# Patient Record
Sex: Female | Born: 1975 | Race: White | Hispanic: Yes | Smoking: Former smoker
Health system: Southern US, Community
[De-identification: ages and names within clinical notes are randomized; demographics above are authoritative.]

## PROBLEM LIST (undated history)

## (undated) DIAGNOSIS — D649 Anemia, unspecified: Secondary | ICD-10-CM

## (undated) DIAGNOSIS — R112 Nausea with vomiting, unspecified: Secondary | ICD-10-CM

## (undated) DIAGNOSIS — R519 Headache, unspecified: Secondary | ICD-10-CM

## (undated) DIAGNOSIS — R51 Headache: Secondary | ICD-10-CM

## (undated) DIAGNOSIS — Z9889 Other specified postprocedural states: Secondary | ICD-10-CM

## (undated) DIAGNOSIS — Z9289 Personal history of other medical treatment: Secondary | ICD-10-CM

## (undated) DIAGNOSIS — Z5189 Encounter for other specified aftercare: Secondary | ICD-10-CM

## (undated) DIAGNOSIS — J45909 Unspecified asthma, uncomplicated: Secondary | ICD-10-CM

## (undated) DIAGNOSIS — J189 Pneumonia, unspecified organism: Secondary | ICD-10-CM

## (undated) HISTORY — PX: ABDOMINAL HYSTERECTOMY: SHX81

## (undated) HISTORY — PX: CHOLECYSTECTOMY: SHX55

## (undated) HISTORY — PX: WISDOM TOOTH EXTRACTION: SHX21

## (undated) HISTORY — PX: NASAL SEPTUM SURGERY: SHX37

---

## 1998-04-14 ENCOUNTER — Ambulatory Visit (HOSPITAL_COMMUNITY): Admission: RE | Admit: 1998-04-14 | Discharge: 1998-04-14 | Payer: Self-pay | Admitting: *Deleted

## 1998-07-22 ENCOUNTER — Encounter: Payer: Self-pay | Admitting: Emergency Medicine

## 1998-07-22 ENCOUNTER — Inpatient Hospital Stay (HOSPITAL_COMMUNITY): Admission: EM | Admit: 1998-07-22 | Discharge: 1998-07-24 | Payer: Self-pay | Admitting: Emergency Medicine

## 1998-07-27 ENCOUNTER — Encounter: Admission: RE | Admit: 1998-07-27 | Discharge: 1998-07-27 | Payer: Self-pay | Admitting: Sports Medicine

## 1998-11-01 ENCOUNTER — Emergency Department (HOSPITAL_COMMUNITY): Admission: EM | Admit: 1998-11-01 | Discharge: 1998-11-01 | Payer: Self-pay | Admitting: Emergency Medicine

## 1998-12-22 ENCOUNTER — Emergency Department (HOSPITAL_COMMUNITY): Admission: EM | Admit: 1998-12-22 | Discharge: 1998-12-22 | Payer: Self-pay | Admitting: Emergency Medicine

## 2000-09-03 ENCOUNTER — Encounter: Payer: Self-pay | Admitting: Emergency Medicine

## 2000-09-03 ENCOUNTER — Emergency Department (HOSPITAL_COMMUNITY): Admission: EM | Admit: 2000-09-03 | Discharge: 2000-09-03 | Payer: Self-pay | Admitting: Emergency Medicine

## 2000-09-29 ENCOUNTER — Emergency Department (HOSPITAL_COMMUNITY): Admission: EM | Admit: 2000-09-29 | Discharge: 2000-09-29 | Payer: Self-pay | Admitting: Emergency Medicine

## 2000-09-30 ENCOUNTER — Observation Stay (HOSPITAL_COMMUNITY): Admission: EM | Admit: 2000-09-30 | Discharge: 2000-10-01 | Payer: Self-pay | Admitting: Emergency Medicine

## 2000-09-30 ENCOUNTER — Encounter: Payer: Self-pay | Admitting: *Deleted

## 2001-02-07 ENCOUNTER — Encounter: Payer: Self-pay | Admitting: Emergency Medicine

## 2001-02-07 ENCOUNTER — Emergency Department (HOSPITAL_COMMUNITY): Admission: EM | Admit: 2001-02-07 | Discharge: 2001-02-07 | Payer: Self-pay | Admitting: Emergency Medicine

## 2001-03-29 ENCOUNTER — Observation Stay (HOSPITAL_COMMUNITY): Admission: AD | Admit: 2001-03-29 | Discharge: 2001-03-30 | Payer: Self-pay | Admitting: Obstetrics & Gynecology

## 2001-03-29 ENCOUNTER — Encounter: Payer: Self-pay | Admitting: *Deleted

## 2001-03-29 ENCOUNTER — Inpatient Hospital Stay (HOSPITAL_COMMUNITY): Admission: AD | Admit: 2001-03-29 | Discharge: 2001-03-29 | Payer: Self-pay | Admitting: Obstetrics & Gynecology

## 2001-04-17 ENCOUNTER — Encounter: Admission: RE | Admit: 2001-04-17 | Discharge: 2001-04-17 | Payer: Self-pay | Admitting: Family Medicine

## 2001-04-24 ENCOUNTER — Ambulatory Visit (HOSPITAL_COMMUNITY): Admission: RE | Admit: 2001-04-24 | Discharge: 2001-04-24 | Payer: Self-pay | Admitting: Obstetrics & Gynecology

## 2001-05-06 ENCOUNTER — Encounter: Admission: RE | Admit: 2001-05-06 | Discharge: 2001-05-06 | Payer: Self-pay | Admitting: Family Medicine

## 2001-06-10 ENCOUNTER — Encounter: Admission: RE | Admit: 2001-06-10 | Discharge: 2001-06-10 | Payer: Self-pay | Admitting: Family Medicine

## 2001-06-22 ENCOUNTER — Inpatient Hospital Stay (HOSPITAL_COMMUNITY): Admission: AD | Admit: 2001-06-22 | Discharge: 2001-06-22 | Payer: Self-pay | Admitting: *Deleted

## 2001-06-24 ENCOUNTER — Encounter: Admission: RE | Admit: 2001-06-24 | Discharge: 2001-06-24 | Payer: Self-pay | Admitting: Family Medicine

## 2001-06-26 ENCOUNTER — Encounter: Admission: RE | Admit: 2001-06-26 | Discharge: 2001-06-26 | Payer: Self-pay | Admitting: Family Medicine

## 2001-07-05 ENCOUNTER — Ambulatory Visit (HOSPITAL_COMMUNITY): Admission: RE | Admit: 2001-07-05 | Discharge: 2001-07-05 | Payer: Self-pay

## 2001-07-12 ENCOUNTER — Encounter: Admission: RE | Admit: 2001-07-12 | Discharge: 2001-07-12 | Payer: Self-pay | Admitting: Family Medicine

## 2001-08-04 ENCOUNTER — Inpatient Hospital Stay (HOSPITAL_COMMUNITY): Admission: AD | Admit: 2001-08-04 | Discharge: 2001-08-04 | Payer: Self-pay | Admitting: *Deleted

## 2001-08-16 ENCOUNTER — Encounter: Admission: RE | Admit: 2001-08-16 | Discharge: 2001-08-16 | Payer: Self-pay | Admitting: Family Medicine

## 2001-08-23 ENCOUNTER — Encounter: Admission: RE | Admit: 2001-08-23 | Discharge: 2001-08-23 | Payer: Self-pay | Admitting: Family Medicine

## 2001-08-26 ENCOUNTER — Encounter: Admission: RE | Admit: 2001-08-26 | Discharge: 2001-08-26 | Payer: Self-pay | Admitting: Family Medicine

## 2001-08-26 ENCOUNTER — Ambulatory Visit (HOSPITAL_COMMUNITY): Admission: RE | Admit: 2001-08-26 | Discharge: 2001-08-26 | Payer: Self-pay | Admitting: Family Medicine

## 2001-09-02 ENCOUNTER — Encounter: Admission: RE | Admit: 2001-09-02 | Discharge: 2001-09-02 | Payer: Self-pay | Admitting: Family Medicine

## 2001-09-11 ENCOUNTER — Encounter: Admission: RE | Admit: 2001-09-11 | Discharge: 2001-09-11 | Payer: Self-pay | Admitting: Family Medicine

## 2001-09-15 ENCOUNTER — Inpatient Hospital Stay (HOSPITAL_COMMUNITY): Admission: AD | Admit: 2001-09-15 | Discharge: 2001-09-16 | Payer: Self-pay | Admitting: *Deleted

## 2001-10-23 ENCOUNTER — Encounter: Admission: RE | Admit: 2001-10-23 | Discharge: 2001-10-23 | Payer: Self-pay | Admitting: Family Medicine

## 2001-10-28 ENCOUNTER — Encounter: Admission: RE | Admit: 2001-10-28 | Discharge: 2001-10-28 | Payer: Self-pay | Admitting: Family Medicine

## 2002-01-27 ENCOUNTER — Encounter: Admission: RE | Admit: 2002-01-27 | Discharge: 2002-01-27 | Payer: Self-pay | Admitting: Family Medicine

## 2002-05-08 ENCOUNTER — Encounter: Admission: RE | Admit: 2002-05-08 | Discharge: 2002-05-08 | Payer: Self-pay | Admitting: Family Medicine

## 2002-05-26 ENCOUNTER — Encounter: Admission: RE | Admit: 2002-05-26 | Discharge: 2002-05-26 | Payer: Self-pay | Admitting: Family Medicine

## 2002-06-18 ENCOUNTER — Encounter: Admission: RE | Admit: 2002-06-18 | Discharge: 2002-06-18 | Payer: Self-pay | Admitting: Family Medicine

## 2002-07-01 ENCOUNTER — Encounter: Admission: RE | Admit: 2002-07-01 | Discharge: 2002-07-01 | Payer: Self-pay | Admitting: Family Medicine

## 2002-07-03 ENCOUNTER — Encounter: Admission: RE | Admit: 2002-07-03 | Discharge: 2002-07-03 | Payer: Self-pay | Admitting: Sports Medicine

## 2002-08-18 ENCOUNTER — Encounter: Admission: RE | Admit: 2002-08-18 | Discharge: 2002-08-18 | Payer: Self-pay | Admitting: Family Medicine

## 2002-12-22 ENCOUNTER — Encounter: Admission: RE | Admit: 2002-12-22 | Discharge: 2002-12-22 | Payer: Self-pay | Admitting: Family Medicine

## 2002-12-24 ENCOUNTER — Ambulatory Visit (HOSPITAL_COMMUNITY): Admission: RE | Admit: 2002-12-24 | Discharge: 2002-12-24 | Payer: Self-pay | Admitting: Family Medicine

## 2002-12-29 ENCOUNTER — Encounter: Admission: RE | Admit: 2002-12-29 | Discharge: 2002-12-29 | Payer: Self-pay | Admitting: Family Medicine

## 2003-01-20 ENCOUNTER — Encounter: Admission: RE | Admit: 2003-01-20 | Discharge: 2003-01-20 | Payer: Self-pay | Admitting: Family Medicine

## 2003-05-11 ENCOUNTER — Encounter: Admission: RE | Admit: 2003-05-11 | Discharge: 2003-05-11 | Payer: Self-pay | Admitting: Family Medicine

## 2003-05-25 ENCOUNTER — Encounter: Admission: RE | Admit: 2003-05-25 | Discharge: 2003-05-25 | Payer: Self-pay | Admitting: Family Medicine

## 2003-06-09 ENCOUNTER — Ambulatory Visit (HOSPITAL_COMMUNITY): Admission: RE | Admit: 2003-06-09 | Discharge: 2003-06-10 | Payer: Self-pay | Admitting: General Surgery

## 2003-06-09 ENCOUNTER — Encounter (INDEPENDENT_AMBULATORY_CARE_PROVIDER_SITE_OTHER): Payer: Self-pay | Admitting: *Deleted

## 2003-08-21 ENCOUNTER — Encounter (INDEPENDENT_AMBULATORY_CARE_PROVIDER_SITE_OTHER): Payer: Self-pay | Admitting: *Deleted

## 2003-08-21 LAB — CONVERTED CEMR LAB

## 2003-08-26 ENCOUNTER — Encounter: Admission: RE | Admit: 2003-08-26 | Discharge: 2003-08-26 | Payer: Self-pay | Admitting: Family Medicine

## 2003-11-18 ENCOUNTER — Encounter: Admission: RE | Admit: 2003-11-18 | Discharge: 2003-11-18 | Payer: Self-pay | Admitting: Family Medicine

## 2004-01-15 ENCOUNTER — Encounter: Admission: RE | Admit: 2004-01-15 | Discharge: 2004-01-15 | Payer: Self-pay | Admitting: Family Medicine

## 2004-02-12 ENCOUNTER — Ambulatory Visit: Payer: Self-pay | Admitting: Family Medicine

## 2004-03-16 ENCOUNTER — Ambulatory Visit: Payer: Self-pay | Admitting: Family Medicine

## 2004-04-05 ENCOUNTER — Ambulatory Visit: Payer: Self-pay | Admitting: Sports Medicine

## 2004-05-11 ENCOUNTER — Ambulatory Visit: Payer: Self-pay | Admitting: Sports Medicine

## 2005-03-21 ENCOUNTER — Ambulatory Visit: Payer: Self-pay | Admitting: Family Medicine

## 2005-04-18 ENCOUNTER — Ambulatory Visit: Payer: Self-pay | Admitting: Family Medicine

## 2006-07-19 DIAGNOSIS — IMO0002 Reserved for concepts with insufficient information to code with codable children: Secondary | ICD-10-CM

## 2006-07-19 DIAGNOSIS — K8020 Calculus of gallbladder without cholecystitis without obstruction: Secondary | ICD-10-CM | POA: Insufficient documentation

## 2006-07-19 DIAGNOSIS — J309 Allergic rhinitis, unspecified: Secondary | ICD-10-CM | POA: Insufficient documentation

## 2006-07-20 ENCOUNTER — Encounter (INDEPENDENT_AMBULATORY_CARE_PROVIDER_SITE_OTHER): Payer: Self-pay | Admitting: *Deleted

## 2006-10-16 ENCOUNTER — Ambulatory Visit: Payer: Self-pay | Admitting: Family Medicine

## 2006-10-16 ENCOUNTER — Encounter (INDEPENDENT_AMBULATORY_CARE_PROVIDER_SITE_OTHER): Payer: Self-pay | Admitting: Family Medicine

## 2006-10-16 LAB — CONVERTED CEMR LAB
Chlamydia, DNA Probe: NEGATIVE
GC Probe Amp, Genital: NEGATIVE
Whiff Test: POSITIVE

## 2006-11-05 ENCOUNTER — Ambulatory Visit: Payer: Self-pay | Admitting: Family Medicine

## 2006-11-06 ENCOUNTER — Encounter: Payer: Self-pay | Admitting: *Deleted

## 2006-11-07 ENCOUNTER — Encounter (INDEPENDENT_AMBULATORY_CARE_PROVIDER_SITE_OTHER): Payer: Self-pay | Admitting: Family Medicine

## 2006-11-09 ENCOUNTER — Encounter (INDEPENDENT_AMBULATORY_CARE_PROVIDER_SITE_OTHER): Payer: Self-pay | Admitting: Family Medicine

## 2007-08-28 ENCOUNTER — Ambulatory Visit: Payer: Self-pay | Admitting: Family Medicine

## 2007-09-09 ENCOUNTER — Ambulatory Visit: Payer: Self-pay | Admitting: Sports Medicine

## 2007-09-09 LAB — CONVERTED CEMR LAB: Rapid Strep: POSITIVE

## 2007-09-20 ENCOUNTER — Ambulatory Visit: Payer: Self-pay | Admitting: Family Medicine

## 2007-09-25 ENCOUNTER — Encounter (INDEPENDENT_AMBULATORY_CARE_PROVIDER_SITE_OTHER): Payer: Self-pay | Admitting: Family Medicine

## 2007-09-30 ENCOUNTER — Telehealth (INDEPENDENT_AMBULATORY_CARE_PROVIDER_SITE_OTHER): Payer: Self-pay | Admitting: *Deleted

## 2007-10-07 ENCOUNTER — Encounter (INDEPENDENT_AMBULATORY_CARE_PROVIDER_SITE_OTHER): Payer: Self-pay | Admitting: *Deleted

## 2007-10-18 ENCOUNTER — Telehealth: Payer: Self-pay | Admitting: *Deleted

## 2007-10-21 ENCOUNTER — Encounter (INDEPENDENT_AMBULATORY_CARE_PROVIDER_SITE_OTHER): Payer: Self-pay | Admitting: Family Medicine

## 2007-10-21 ENCOUNTER — Ambulatory Visit: Payer: Self-pay | Admitting: Family Medicine

## 2007-10-21 LAB — CONVERTED CEMR LAB
Chlamydia, DNA Probe: NEGATIVE
GC Probe Amp, Genital: NEGATIVE

## 2008-01-15 ENCOUNTER — Ambulatory Visit: Payer: Self-pay | Admitting: Family Medicine

## 2008-01-15 ENCOUNTER — Telehealth: Payer: Self-pay | Admitting: *Deleted

## 2008-01-15 ENCOUNTER — Encounter (INDEPENDENT_AMBULATORY_CARE_PROVIDER_SITE_OTHER): Payer: Self-pay | Admitting: Family Medicine

## 2008-01-27 ENCOUNTER — Emergency Department (HOSPITAL_COMMUNITY): Admission: EM | Admit: 2008-01-27 | Discharge: 2008-01-27 | Payer: Self-pay | Admitting: Emergency Medicine

## 2008-03-20 ENCOUNTER — Telehealth: Payer: Self-pay | Admitting: *Deleted

## 2008-03-20 ENCOUNTER — Ambulatory Visit: Payer: Self-pay | Admitting: Family Medicine

## 2008-03-21 ENCOUNTER — Emergency Department (HOSPITAL_COMMUNITY): Admission: EM | Admit: 2008-03-21 | Discharge: 2008-03-21 | Payer: Self-pay | Admitting: Family Medicine

## 2008-03-26 ENCOUNTER — Ambulatory Visit: Payer: Self-pay | Admitting: Family Medicine

## 2008-03-26 ENCOUNTER — Telehealth: Payer: Self-pay | Admitting: *Deleted

## 2008-03-26 ENCOUNTER — Ambulatory Visit (HOSPITAL_COMMUNITY): Admission: RE | Admit: 2008-03-26 | Discharge: 2008-03-26 | Payer: Self-pay | Admitting: Family Medicine

## 2008-03-26 LAB — CONVERTED CEMR LAB: Rapid Strep: POSITIVE

## 2008-05-27 ENCOUNTER — Telehealth: Payer: Self-pay | Admitting: *Deleted

## 2008-05-28 ENCOUNTER — Telehealth: Payer: Self-pay | Admitting: *Deleted

## 2008-05-29 ENCOUNTER — Encounter (INDEPENDENT_AMBULATORY_CARE_PROVIDER_SITE_OTHER): Payer: Self-pay | Admitting: Family Medicine

## 2008-05-29 ENCOUNTER — Ambulatory Visit: Payer: Self-pay | Admitting: Family Medicine

## 2008-05-29 DIAGNOSIS — D509 Iron deficiency anemia, unspecified: Secondary | ICD-10-CM

## 2008-05-29 LAB — CONVERTED CEMR LAB
Albumin: 4.3 g/dL (ref 3.5–5.2)
Alkaline Phosphatase: 58 units/L (ref 39–117)
CO2: 22 meq/L (ref 19–32)
Calcium: 8.7 mg/dL (ref 8.4–10.5)
Chloride: 105 meq/L (ref 96–112)
Glucose, Bld: 78 mg/dL (ref 70–99)
Hemoglobin: 8.2 g/dL — ABNORMAL LOW (ref 12.0–15.0)
MCHC: 28.3 g/dL — ABNORMAL LOW (ref 30.0–36.0)
Potassium: 4.1 meq/L (ref 3.5–5.3)
RBC: 4.46 M/uL (ref 3.87–5.11)
Sodium: 139 meq/L (ref 135–145)
Total Protein: 7.3 g/dL (ref 6.0–8.3)
WBC: 6.2 10*3/uL (ref 4.0–10.5)

## 2008-06-01 ENCOUNTER — Encounter (INDEPENDENT_AMBULATORY_CARE_PROVIDER_SITE_OTHER): Payer: Self-pay | Admitting: Family Medicine

## 2008-06-01 LAB — CONVERTED CEMR LAB
Iron: 11 ug/dL — ABNORMAL LOW (ref 42–145)
Vitamin B-12: 647 pg/mL (ref 211–911)

## 2008-07-07 ENCOUNTER — Encounter (INDEPENDENT_AMBULATORY_CARE_PROVIDER_SITE_OTHER): Payer: Self-pay | Admitting: Family Medicine

## 2008-07-07 ENCOUNTER — Ambulatory Visit: Payer: Self-pay | Admitting: Family Medicine

## 2008-07-07 LAB — CONVERTED CEMR LAB: Retic Ct Pct: 0.8 % (ref 0.4–3.1)

## 2008-07-24 ENCOUNTER — Ambulatory Visit: Payer: Self-pay | Admitting: Family Medicine

## 2008-07-24 ENCOUNTER — Encounter (INDEPENDENT_AMBULATORY_CARE_PROVIDER_SITE_OTHER): Payer: Self-pay | Admitting: Family Medicine

## 2008-07-24 DIAGNOSIS — F5089 Other specified eating disorder: Secondary | ICD-10-CM | POA: Insufficient documentation

## 2008-07-24 LAB — CONVERTED CEMR LAB
Chlamydia, DNA Probe: NEGATIVE
GC Probe Amp, Genital: NEGATIVE

## 2008-07-27 ENCOUNTER — Encounter: Payer: Self-pay | Admitting: Family Medicine

## 2008-11-25 ENCOUNTER — Encounter: Payer: Self-pay | Admitting: Family Medicine

## 2008-11-25 ENCOUNTER — Ambulatory Visit: Payer: Self-pay | Admitting: Family Medicine

## 2008-11-25 LAB — CONVERTED CEMR LAB
Cholesterol: 164 mg/dL (ref 0–200)
MCHC: 29.5 g/dL — ABNORMAL LOW (ref 30.0–36.0)
MCV: 83.5 fL (ref 78.0–100.0)
Platelets: 484 10*3/uL — ABNORMAL HIGH (ref 150–400)
Total CHOL/HDL Ratio: 4.6
WBC: 7.7 10*3/uL (ref 4.0–10.5)

## 2008-11-27 ENCOUNTER — Telehealth: Payer: Self-pay | Admitting: Family Medicine

## 2008-11-30 ENCOUNTER — Encounter: Payer: Self-pay | Admitting: Family Medicine

## 2009-03-17 ENCOUNTER — Ambulatory Visit: Payer: Self-pay | Admitting: Family Medicine

## 2009-03-17 DIAGNOSIS — E781 Pure hyperglyceridemia: Secondary | ICD-10-CM | POA: Insufficient documentation

## 2009-04-26 ENCOUNTER — Ambulatory Visit: Payer: Self-pay | Admitting: Obstetrics & Gynecology

## 2009-04-26 ENCOUNTER — Ambulatory Visit: Admission: AD | Admit: 2009-04-26 | Discharge: 2009-04-26 | Payer: Self-pay | Admitting: Obstetrics & Gynecology

## 2009-04-27 ENCOUNTER — Encounter: Payer: Self-pay | Admitting: Family Medicine

## 2009-05-05 ENCOUNTER — Ambulatory Visit: Payer: Self-pay | Admitting: Family Medicine

## 2009-05-05 ENCOUNTER — Encounter: Payer: Self-pay | Admitting: Family Medicine

## 2009-05-05 LAB — CONVERTED CEMR LAB
Hemoglobin: 8.8 g/dL — ABNORMAL LOW (ref 12.0–15.0)
MCHC: 27.9 g/dL — ABNORMAL LOW (ref 30.0–36.0)
MCV: 69.8 fL — ABNORMAL LOW (ref 78.0–100.0)
RBC: 4.51 M/uL (ref 3.87–5.11)

## 2009-05-06 ENCOUNTER — Telehealth: Payer: Self-pay | Admitting: Family Medicine

## 2009-06-25 ENCOUNTER — Inpatient Hospital Stay (HOSPITAL_COMMUNITY): Admission: AD | Admit: 2009-06-25 | Discharge: 2009-06-25 | Payer: Self-pay | Admitting: Obstetrics and Gynecology

## 2009-06-25 ENCOUNTER — Ambulatory Visit: Payer: Self-pay | Admitting: Obstetrics and Gynecology

## 2009-07-28 ENCOUNTER — Encounter: Payer: Self-pay | Admitting: Family Medicine

## 2009-07-29 ENCOUNTER — Ambulatory Visit: Payer: Self-pay | Admitting: Family Medicine

## 2009-07-29 ENCOUNTER — Encounter: Payer: Self-pay | Admitting: Family Medicine

## 2009-07-29 DIAGNOSIS — R5383 Other fatigue: Secondary | ICD-10-CM

## 2009-07-29 DIAGNOSIS — R5381 Other malaise: Secondary | ICD-10-CM

## 2009-07-30 ENCOUNTER — Encounter: Payer: Self-pay | Admitting: Family Medicine

## 2009-07-30 LAB — CONVERTED CEMR LAB
ALT: 16 U/L
AST: 15 U/L
Albumin: 4.1 g/dL
Alkaline Phosphatase: 56 U/L
BUN: 9 mg/dL
CO2: 21 meq/L
Calcium: 8.9 mg/dL
Chloride: 104 meq/L
Creatinine, Ser: 0.44 mg/dL
Glucose, Bld: 81 mg/dL
HCT: 25.9 % — ABNORMAL LOW
Hemoglobin: 7.2 g/dL — ABNORMAL LOW
MCHC: 27.8 g/dL — ABNORMAL LOW
MCV: 71.5 fL — ABNORMAL LOW
Platelets: 412 K/uL — ABNORMAL HIGH
Potassium: 4.2 meq/L
RBC: 3.62 M/uL — ABNORMAL LOW
RDW: 18 % — ABNORMAL HIGH
Sodium: 138 meq/L
TSH: 0.724 u[IU]/mL
Total Bilirubin: 0.3 mg/dL
Total Protein: 7.1 g/dL
WBC: 4.6 10*3/microliter

## 2010-02-26 ENCOUNTER — Emergency Department (HOSPITAL_COMMUNITY): Admission: EM | Admit: 2010-02-26 | Discharge: 2010-02-26 | Payer: Self-pay | Admitting: Emergency Medicine

## 2010-03-09 ENCOUNTER — Encounter: Payer: Self-pay | Admitting: Family Medicine

## 2010-06-06 ENCOUNTER — Inpatient Hospital Stay (HOSPITAL_COMMUNITY)
Admission: AD | Admit: 2010-06-06 | Discharge: 2010-06-06 | Payer: Self-pay | Source: Home / Self Care | Attending: Obstetrics and Gynecology | Admitting: Obstetrics and Gynecology

## 2010-06-08 ENCOUNTER — Ambulatory Visit (HOSPITAL_COMMUNITY)
Admission: RE | Admit: 2010-06-08 | Discharge: 2010-06-08 | Payer: Self-pay | Source: Home / Self Care | Attending: Obstetrics and Gynecology | Admitting: Obstetrics and Gynecology

## 2010-06-08 LAB — WET PREP, GENITAL
Trich, Wet Prep: NONE SEEN
Yeast Wet Prep HPF POC: NONE SEEN

## 2010-06-08 LAB — URINALYSIS, ROUTINE W REFLEX MICROSCOPIC
Bilirubin Urine: NEGATIVE
Ketones, ur: NEGATIVE mg/dL
Leukocytes, UA: NEGATIVE
Nitrite: NEGATIVE
Protein, ur: NEGATIVE mg/dL
Specific Gravity, Urine: <1.005 — ABNORMAL LOW (ref 1.005–1.030)
Urine Glucose, Fasting: NEGATIVE mg/dL
Urobilinogen, UA: 0.2 mg/dL (ref 0.0–1.0)
pH: 6 (ref 5.0–8.0)

## 2010-06-08 LAB — CBC
HCT: 29.1 % — ABNORMAL LOW (ref 36.0–46.0)
Hemoglobin: 8.2 g/dL — ABNORMAL LOW (ref 12.0–15.0)
MCH: 18.6 pg — ABNORMAL LOW (ref 26.0–34.0)
MCHC: 28.2 g/dL — ABNORMAL LOW (ref 30.0–36.0)
MCV: 66.1 fL — ABNORMAL LOW (ref 78.0–100.0)
Platelets: 486 10*3/uL — ABNORMAL HIGH (ref 150–400)
RBC: 4.4 MIL/uL (ref 3.87–5.11)
RDW: 19.8 % — ABNORMAL HIGH (ref 11.5–15.5)
WBC: 8.3 10*3/uL (ref 4.0–10.5)

## 2010-06-08 LAB — POCT PREGNANCY, URINE: Preg Test, Ur: POSITIVE

## 2010-06-08 LAB — ABO/RH: ABO/RH(D): O POS

## 2010-06-08 LAB — HCG, QUANTITATIVE, PREGNANCY: hCG, Beta Chain, Quant, S: 2422 m[IU]/mL — ABNORMAL HIGH (ref <5)

## 2010-06-08 LAB — GC/CHLAMYDIA PROBE AMP, GENITAL
Chlamydia, DNA Probe: NEGATIVE
GC Probe Amp, Genital: NEGATIVE

## 2010-06-08 LAB — URINE MICROSCOPIC-ADD ON

## 2010-06-10 ENCOUNTER — Ambulatory Visit (HOSPITAL_COMMUNITY)
Admission: RE | Admit: 2010-06-10 | Discharge: 2010-06-10 | Payer: Self-pay | Source: Home / Self Care | Attending: Obstetrics & Gynecology | Admitting: Obstetrics & Gynecology

## 2010-06-13 ENCOUNTER — Ambulatory Visit
Admission: RE | Admit: 2010-06-13 | Discharge: 2010-06-13 | Payer: Self-pay | Source: Home / Self Care | Attending: Family Medicine | Admitting: Family Medicine

## 2010-06-13 LAB — HCG, QUANTITATIVE, PREGNANCY
hCG, Beta Chain, Quant, S: 1229 m[IU]/mL — ABNORMAL HIGH (ref <5)
hCG, Beta Chain, Quant, S: 873 m[IU]/mL — ABNORMAL HIGH (ref <5)

## 2010-06-18 ENCOUNTER — Ambulatory Visit (HOSPITAL_COMMUNITY)
Admission: RE | Admit: 2010-06-18 | Discharge: 2010-06-18 | Payer: Self-pay | Source: Home / Self Care | Attending: Obstetrics & Gynecology | Admitting: Obstetrics & Gynecology

## 2010-06-21 NOTE — Miscellaneous (Signed)
Summary: walk in  Clinical Lists Changes came in c/o resumption of heavy period as of yesterday. had a miscarriage in dec/10. was to take provera x 10 days . took them for 5 days. took one of them yesterday. made her an appt for tomorrow. did not want to wait for pcp. fears anemia again.Marland KitchenMarland KitchenGolden Circle RN  July 28, 2009 4:25 PM

## 2010-06-21 NOTE — Miscellaneous (Signed)
Summary: Asthma,  intermittant  Clinical Lists Changes  Problems: Changed problem from ASTHMA, UNSPECIFIED (ICD-493.90) to ASTHMA, INTERMITTENT (ICD-493.90) Removed problem of DENTAL PAIN (ICD-525.9)

## 2010-06-21 NOTE — Letter (Signed)
Summary: Generic Letter  [facility]  [address]   [place], [place] [address]   Phone: [phone]  Fax: [phone]    07/30/2009  Stephanie Stanton [address] [place], [place]  [address]  Dear Ms. Stanton,   I tried to reach you by phone, but was unable to.  I wanted to let you know that you are quite anemic (more so than in December).  It is very important for you to take your iron two times a day, every day.  Please call me if you have any questions or concerns.   I look forward to seeing you at your appointment at the end of the month.       Sincerely,   Asher Muir MD  Appended Document: Generic Letter pt letter mailed  Appended Document: Generic Letter letter returned - has an appt 3/31

## 2010-06-21 NOTE — Assessment & Plan Note (Signed)
Summary: heavy periods-see notes/[place]/Jacci Ruberg   Vital Signs:  Patient profile:   35 year old female Height:      63 inches Weight:      144.7 pounds BMI:     25.73 Temp:     97.9 degrees F oral Pulse rate:   77 / minute BP sitting:   98 / 62  (left arm) Cuff size:   regular  Vitals Entered By: Gladstone Pih (July 29, 2009 8:51 AM) CC: anemia Is Patient Diabetic? No Pain Assessment Patient in pain? no        Primary Care Provider:  Asher Muir MD  CC:  anemia.  History of Present Illness: anemia--found to have anemia at [facility] in 05/2008.  started on iron (which she only took abouut 2 weeks.).  next time hgb checked 11.5 in 11/2008.  then had miscarriage in December.  found have hgb in the 8s (at [facility]).  repeat hgb done here was 8.8.  told to start iron.  took iron two times once a day for 2 weeks.  felt better then stopped taking.  since that time, she has started feeling tired again.  she also had a very heavy menstrual period in January-->[facility]  given script for 10 days of provera.  she took it for about 5 days then stopped because it made her feel "weird."  then 2 days ago her period started again and was heavy.  she is wondering if she is anemic again.  I had prescribed her ocps last visit, but forgot to hand her the script; so she never got them.  still interested in starting them  Habits & Providers  Alcohol-Tobacco-Diet     Tobacco Status: never  Allergies: 1)  Aspirin (Aspirin)  Review of Systems General:  Complains of weakness; denies fever, loss of appetite, and weight loss; fatigue for past 2 months. GU:  heavy menstruation. MS:  Denies joint pain and joint swelling; sometimes sore arms after sweeping, mopping, etc. Derm:  sometimes itchy at night under breasts. Psych:  Denies depression. Endo:  Denies cold intolerance.  Physical Exam  General:  Well-developed,well-nourished,in no acute distress; alert,appropriate and  cooperative throughout examination Eyes:  extremely pale conjunctiva Mouth:  o/p without lesions or exudates Neck:  No deformities, masses, or tenderness noted. Lungs:  Normal respiratory effort, chest expands symmetrically. Lungs are clear to auscultation, no crackles or wheezes. Heart:  Normal rate and regular rhythm. S1 and S2 normal without gallop, murmur, click, rub or other extra sounds. Abdomen:  Bowel sounds positive,abdomen soft and non-tender without masses, organomegaly or hernias noted. Msk:  5/5 strength in the major muscle groups of all 4 extremities Extremities:  no edema Skin:  turgor normal and color normal.   Axillary Nodes:  No palpable lymphadenopathy Additional Exam:  vital signs reviewed    Impression & Recommendations:  Problem # 1:  ANEMIA, IRON DEFICIENCY (ICD-280.9) Assessment Unchanged recheck cbc.  advised to start taking iron again (don't stop until we talk).  I don't think there was ever a great explanation for anemia--she never had heavy periods prior to the past few months.  if she is still anemic, we should at least consider gi referral for colonoscopy.  my other concern is that her plts have always been elevated, indicating inflammation (potentially).  I will also check an ESR.  f/u 3 weeks Her updated medication list for this problem includes:    Iron 325 (65 Fe) Mg Tabs (Ferrous sulfate) .Marland KitchenMarland KitchenMarland KitchenMarland Kitchen  1 tablet by mouth two times a day for anemia  Orders: CBC-[facility] (16109) [facility]- Est  Level 4 (60454)  Problem # 2:  CONTRACEPTIVE MANAGEMENT (ICD-V25.9) Assessment: Unchanged  start ocps.  this should help with her bleeding as well.  Orders: [facility]- Est  Level 4 (09811)  Problem # 3:  FATIGUE (ICD-780.79) Assessment: New likely she is anemic and this is causing her fatigue.  also check TSH and ESR Orders: TSH-[facility] ([id number]) Comp Met-[facility] (91478-29562) Sed Rate (ESR)-[facility] (85651) [facility]- Est  Level 4 (99214)  Complete Medication List: 1)  Ventolin Hfa 108 (90  Base) Mcg/act Aers (Albuterol sulfate) .... 2 puffs q 4 hours as needed for wheeze 2)  Iron 325 (65 Fe) Mg Tabs (Ferrous sulfate) .Marland Kitchen.. 1 tablet by mouth two times a day for anemia 3)  Colace 100 Mg Caps (Docusate sodium) .Marland Kitchen.. 1 tablet by mouth two times a day as needed for constipation 4)  Ibuprofen 600 Mg Tabs (Ibuprofen) .Marland Kitchen.. 1 tab by mouth three times a day as needed tooth pain 5)  Sprintec 28 0.25-35 Mg-mcg Tabs (Norgestimate-eth estradiol) .Marland Kitchen.. 1 tab by mouth daily for birth control  Patient Instructions: 1)  It was nice to see you today. 2)  Start taking your iron again. 3)  Start taking the birth control pills. 4)  I will call you with lab results. 5)  Please schedule a follow-up appointment in 2-3 weeks.  Prescriptions: SPRINTEC 28 0.25-35 MG-MCG TABS (NORGESTIMATE-ETH ESTRADIOL) 1 tab by mouth daily for birth control  #1 pack x 12   Entered and Authorized by:   Asher Muir MD   Signed by:   Asher Muir MD on 07/29/2009   Method used:   Print then Give to Patient   RxID:   [id number] IRON 325 (65 FE) MG TABS (FERROUS SULFATE) 1 tablet by mouth two times a day for anemia  #60 x 6   Entered and Authorized by:   Asher Muir MD   Signed by:   Asher Muir MD on 07/29/2009   Method used:   Print then Give to Patient   RxID:   [id number]   Laboratory Results   Blood Tests   Date/Time Received: July 29, 2009 10:30 AM  Date/Time Reported: July 29, 2009 12:07 PM   SED rate: 16 mm/hr  Comments: ...............test performed by......Marland KitchenBonnie A. [place], MLS (ASCP)cm

## 2010-06-23 NOTE — Assessment & Plan Note (Signed)
Summary: asthma prob,df   Vital Signs:  Patient profile:   35 year old female Height:      63 inches Weight:      147.1 pounds BMI:     26.15 Temp:     97.7 degrees F oral Pulse rate:   79 / minute BP sitting:   100 / 65  (left arm) Cuff size:   regular  Vitals Entered By: Garen Grams LPN (June 13, 2010 3:22 PM)  Serial Vital Signs/Assessments:  Comments: 3:23 PM Peak Flow Rates:  1- 360 2- 400 3- 360 By: Garen Grams LPN   CC: more frequent use of albuterol over past 3 weeks Is Patient Diabetic? No Pain Assessment Patient in pain? no        Primary Provider:  Asher Muir MD  CC:  more frequent use of albuterol over past 3 weeks.  History of Present Illness: pt has a history of asthma however, she states it was well controlled for the past 4 years requiring only albuterol as needed.  however, since her exacerbation 3 weeks ago, she has continued usage of her albuterol inhaler up to 5 times daily.  she states her symptoms are worse at night, resulting in daily nighttime awakenings.  she does note allergic symptoms however, she has not tried any medication recently.  she states trying claritin, allegra without much relief.    Preventive Screening-Counseling & Management  Alcohol-Tobacco     Smoking Status: never  Allergies: 1)  Aspirin (Aspirin)  Past History:  Past medical, surgical, family and social histories (including risk factors) reviewed, and no changes noted (except as noted below).  Past Medical History: Reviewed history from 05/05/2009 and no changes required. Mirena IUD- June 2009. nasal septum deviation- evaluated at [facility] miscarriage and subsequent D&C 12/10 (likely that IUD fell out)  Past Surgical History: Reviewed history from 07/07/2008 and no changes required. IUD placement - October 21, 2007.  Family History: Reviewed history and no changes required.  Social History: Reviewed history from 07/24/2008 and no changes  required. Works at [employer] for past year.  Likes job.  Lives at home with 5 children (19, 16, 15, 14, 7yo).  Separated from husband.  Physical Exam  General:  Well-developed,well-nourished,in no acute distress; alert,appropriate and cooperative throughout examination Eyes:  No corneal or conjunctival inflammation noted. EOMI. Perrla. Funduscopic exam benign, without hemorrhages, exudates or papilledema. Vision grossly normal. Ears:  External ear exam shows no significant lesions or deformities.  Otoscopic examination reveals clear canals, tympanic membranes are intact bilaterally without bulging, retraction, inflammation or discharge. Hearing is grossly normal bilaterally. Nose:  turbinate hypertrophy, mucosal erythema, no discharge Mouth:  Oral mucosa and oropharynx without lesions or exudates.  Teeth in good repair. Neck:  No deformities, masses, or tenderness noted. Lungs:  Normal respiratory effort, chest expands symmetrically. Lungs are clear to auscultation, no crackles or wheezes. Heart:  Normal rate and regular rhythm. S1 and S2 normal without gallop, murmur, click, rub or other extra sounds.   Impression & Recommendations:  Problem # 1:  ASTHMA, INTERMITTENT (ICD-493.90) Assessment Deteriorated  Her updated medication list for this problem includes:    Ventolin Hfa 108 (90 Base) Mcg/act Aers (Albuterol sulfate) .Marland Kitchen... 2 puffs q 4 hours as needed for wheeze  will try zyrtec and flonase for allergic rhinitis component of asthma symptoms.  if pt remains poorly controlled anticipate restarting advair. f/u in 2 weeks.  Orders: [facility]- Est Level  3 (91478)  Complete Medication List: 1)  Ventolin Hfa 108 (90 Base) Mcg/act Aers (Albuterol sulfate) .... 2 puffs q 4 hours as needed for wheeze 2)  Iron 325 (65 Fe) Mg Tabs (Ferrous sulfate) .Marland Kitchen.. 1 tablet by mouth two times a day for anemia 3)  Colace 100 Mg Caps (Docusate sodium) .Marland Kitchen.. 1 tablet by mouth two times a day as needed for  constipation 4)  Ibuprofen 600 Mg Tabs (Ibuprofen) .Marland Kitchen.. 1 tab by mouth three times a day as needed tooth pain 5)  Sprintec 28 0.25-35 Mg-mcg Tabs (Norgestimate-eth estradiol) .Marland Kitchen.. 1 tab by mouth daily for birth control 6)  Zyrtec Allergy 10 Mg Caps (Cetirizine hcl) .... One tab by mouth daily for allergies 7)  Flonase 50 Mcg/act Susp (Fluticasone propionate) .... 2 sprays per nostril daily for allergic rhinitis  Patient Instructions: 1)  It was a pleasure to care for you today. 2)  Please make an appointment in 2 weeks follow up asthma and allergies.  3)  Go immediately to the ED if with any difficulty breathing, fever, chills, chest pain, or any other concerning symptoms.  Prescriptions: FLONASE 50 MCG/ACT SUSP (FLUTICASONE PROPIONATE) 2 sprays per nostril daily for allergic rhinitis  #1 bottle x 0   Entered and Authorized by:   Maryelizabeth Kaufmann MD   Signed by:   Maryelizabeth Kaufmann MD on 06/13/2010   Method used:   Print then Give to Patient   RxID:   [id number] ZYRTEC ALLERGY 10 MG CAPS (CETIRIZINE HCL) one tab by mouth daily for allergies  #90 x 0   Entered and Authorized by:   Maryelizabeth Kaufmann MD   Signed by:   Maryelizabeth Kaufmann MD on 06/13/2010   Method used:   Print then Give to Patient   RxID:   [id number] VENTOLIN HFA 108 (90 BASE) MCG/ACT AERS (ALBUTEROL SULFATE) 2 puffs q 4 hours as needed for wheeze  #1 x 3   Entered and Authorized by:   Maryelizabeth Kaufmann MD   Signed by:   Maryelizabeth Kaufmann MD on 06/13/2010   Method used:   Print then Give to Patient   RxID:   [id number]    Orders Added: 1)  [facility]- Est Level  3 [01027]

## 2010-06-29 ENCOUNTER — Ambulatory Visit: Payer: Self-pay | Admitting: Family Medicine

## 2010-06-29 ENCOUNTER — Encounter: Payer: Self-pay | Admitting: Obstetrics and Gynecology

## 2010-06-29 DIAGNOSIS — O039 Complete or unspecified spontaneous abortion without complication: Secondary | ICD-10-CM

## 2010-08-03 ENCOUNTER — Ambulatory Visit: Payer: Self-pay | Admitting: Family Medicine

## 2010-08-04 ENCOUNTER — Encounter: Payer: Self-pay | Admitting: Family Medicine

## 2010-08-04 ENCOUNTER — Ambulatory Visit (INDEPENDENT_AMBULATORY_CARE_PROVIDER_SITE_OTHER): Payer: Self-pay | Admitting: Family Medicine

## 2010-08-04 VITALS — BP 94/61 | HR 70 | Temp 98.2°F | Ht 63.0 in | Wt 144.5 lb

## 2010-08-04 DIAGNOSIS — IMO0002 Reserved for concepts with insufficient information to code with codable children: Secondary | ICD-10-CM

## 2010-08-04 DIAGNOSIS — J45909 Unspecified asthma, uncomplicated: Secondary | ICD-10-CM

## 2010-08-04 MED ORDER — ALBUTEROL SULFATE HFA 108 (90 BASE) MCG/ACT IN AERS: 2.0000 | INHALATION_SPRAY | RESPIRATORY_TRACT | Status: DC | PRN

## 2010-08-04 MED ORDER — FLUTICASONE PROPIONATE HFA 220 MCG/ACT IN AERO: 1.0000 | INHALATION_SPRAY | Freq: Two times a day (BID) | RESPIRATORY_TRACT | Status: DC

## 2010-08-04 NOTE — Patient Instructions (Addendum)
Start usuing flovent twice a day to prevent asthma flares Make follow-up appointment in 3-4 weeks to discuss how you are doing, Call sooner if needed

## 2010-08-04 NOTE — Progress Notes (Signed)
  Subjective:    Patient ID: Leasia Swann, female    DOB: 1975/06/26, 35 y.o.   MRN: [id number]  HPI Saw Dr. Orvan Falconer for asthma at last visit, was given zyrtec  Had asthma as a child and  adult then did not have a problem for 4 years until 3 months ago.  Has history of frequent ER visits in previous years.  Was on advair at the time, patient discontinued it to see if she could do without it, and she was ok for 4 years until now. Notes using albuterol several times nightly which is an improvement due to starting zyrtec. + dyspnea, nocturnal cough.  Patient states normal triggers for her are cigarettes smoke, aspirin, carpet, exercise.  No known seasonal or weather triggers.    Review of Systemsno fever, chills, pain, rhinorrhea.     Objective:   Physical Exam  Constitutional: She appears well-developed and well-nourished. No distress.  Cardiovascular: Normal rate and regular rhythm.   Pulmonary/Chest: Effort normal and breath sounds normal. No respiratory distress. She has no wheezes. She has no rales.          Assessment & Plan:

## 2010-08-05 NOTE — Assessment & Plan Note (Addendum)
Peak flow at last visit and today both 400 which is expected for her.  Will start medium dose inhaled steroid today.  Patient uses health department formulary which has singulair, advair as well if needed in the future) Will follow-up in 3-4 weeks.

## 2010-08-11 LAB — POCT PREGNANCY, URINE: Preg Test, Ur: NEGATIVE

## 2010-08-11 LAB — CBC
HCT: 29.1 % — ABNORMAL LOW (ref 36.0–46.0)
Hemoglobin: 9.1 g/dL — ABNORMAL LOW (ref 12.0–15.0)
WBC: 6.5 10*3/uL (ref 4.0–10.5)

## 2010-08-11 LAB — WET PREP, GENITAL
Clue Cells Wet Prep HPF POC: NONE SEEN
WBC, Wet Prep HPF POC: NONE SEEN

## 2010-08-11 LAB — GC/CHLAMYDIA PROBE AMP, GENITAL
Chlamydia, DNA Probe: NEGATIVE
GC Probe Amp, Genital: NEGATIVE

## 2010-08-12 ENCOUNTER — Other Ambulatory Visit: Payer: Self-pay | Admitting: Family Medicine

## 2010-08-12 DIAGNOSIS — N63 Unspecified lump in unspecified breast: Secondary | ICD-10-CM

## 2010-08-12 NOTE — Progress Notes (Unsigned)
NAMEBILLIJO, DILLING NO.:  [id number]  MEDICAL RECORD NO.:  [id number]           PATIENT TYPE:  A  LOCATION:  [facility]                   FACILITY:  [facility]  PHYSICIAN:  Argentina Donovan, MD        DATE OF BIRTH:  Sep 26, 1975  DATE OF SERVICE:                                 CLINIC NOTE  HISTORY OF PRESENT ILLNESS:  The patient is a 35 year old Spanish- speaking Hispanic female who was referred by the [facility]. She went in there and had an ultrasound because of some bleeding on May 27, 2010.  He which has showed no sign of an intrauterine pregnancy.  She went back their on June 18, 2010, had a quantitative beta at 564. Subsequently, she started bleeding, had a lot of pain for few days and then passed a large amount of blood, and the bleeding and pain has stopped.  I am supposing that this woman had a complete abortion. However, I am going to get a quantitative beta today, and if that is not pretty well down to 0 or if it is going up, then we will get an ultrasound at that point.  I talked to her about our plan.  She wants a ParaGard, and I have referred her to the Health Department for that.  IMPRESSION:  Probable complete abortion.          ______________________________ Argentina Donovan, MD    PR/MEDQ  D:  06/29/2010  T:  06/30/2010  Job:  [id number]

## 2010-08-13 ENCOUNTER — Inpatient Hospital Stay (INDEPENDENT_AMBULATORY_CARE_PROVIDER_SITE_OTHER)
Admission: RE | Admit: 2010-08-13 | Discharge: 2010-08-13 | Disposition: A | Discharge status: Home / Self Care | Payer: Self-pay | Source: Ambulatory Visit | Attending: Emergency Medicine | Admitting: Emergency Medicine

## 2010-08-13 DIAGNOSIS — S61209A Unspecified open wound of unspecified finger without damage to nail, initial encounter: Secondary | ICD-10-CM

## 2010-08-17 ENCOUNTER — Ambulatory Visit
Admission: RE | Admit: 2010-08-17 | Discharge: 2010-08-17 | Disposition: A | Discharge status: Home / Self Care | Payer: Self-pay | Source: Ambulatory Visit | Attending: Family Medicine | Admitting: Family Medicine

## 2010-08-17 ENCOUNTER — Other Ambulatory Visit: Payer: Self-pay | Admitting: Family Medicine

## 2010-08-17 DIAGNOSIS — N63 Unspecified lump in unspecified breast: Secondary | ICD-10-CM

## 2010-08-21 HISTORY — PX: CT ABD W & PELVIS WO CM: HXRAD294

## 2010-08-22 ENCOUNTER — Ambulatory Visit: Payer: Self-pay | Admitting: Family Medicine

## 2010-08-22 DIAGNOSIS — Z4802 Encounter for removal of sutures: Secondary | ICD-10-CM

## 2010-08-22 NOTE — Progress Notes (Signed)
Patient had laceration to  Index finger right hand on 08/13/2010 and had sutures placed in ED. In today for suture removal. Five sutures removed. Patient states  area is tender and sore. Dr. Leveda Anna came in to look at wound and does not feel it is infected. Advised to apply neosporin ointment daily and cover with a bandaid to protect.

## 2010-08-23 LAB — COMPREHENSIVE METABOLIC PANEL
ALT: 16 U/L (ref 0–35)
AST: 19 U/L (ref 0–37)
Albumin: 3.8 g/dL (ref 3.5–5.2)
Calcium: 8.6 mg/dL (ref 8.4–10.5)
Creatinine, Ser: 0.39 mg/dL — ABNORMAL LOW (ref 0.4–1.2)
GFR calc Af Amer: >60 mL/min (ref >60)
Sodium: 135 mEq/L (ref 135–145)
Total Protein: 6.9 g/dL (ref 6.0–8.3)

## 2010-08-23 LAB — CBC
MCHC: 30.7 g/dL (ref 30.0–36.0)
MCV: 66.6 fL — ABNORMAL LOW (ref 78.0–100.0)
Platelets: 455 10*3/uL — ABNORMAL HIGH (ref 150–400)
RBC: 3.72 MIL/uL — ABNORMAL LOW (ref 3.87–5.11)

## 2010-08-23 LAB — TYPE AND SCREEN
ABO/RH(D): O POS
Antibody Screen: NEGATIVE

## 2010-08-23 LAB — URINALYSIS, ROUTINE W REFLEX MICROSCOPIC
Bilirubin Urine: NEGATIVE
Leukocytes, UA: NEGATIVE
Nitrite: NEGATIVE
Specific Gravity, Urine: >1.03 — ABNORMAL HIGH (ref 1.005–1.030)
Urobilinogen, UA: 0.2 mg/dL (ref 0.0–1.0)
pH: 5.5 (ref 5.0–8.0)

## 2010-08-23 LAB — WET PREP, GENITAL
Clue Cells Wet Prep HPF POC: NONE SEEN
Trich, Wet Prep: NONE SEEN
Yeast Wet Prep HPF POC: NONE SEEN

## 2010-08-23 LAB — POCT PREGNANCY, URINE: Preg Test, Ur: POSITIVE

## 2010-08-23 LAB — URINE MICROSCOPIC-ADD ON

## 2010-08-23 LAB — GC/CHLAMYDIA PROBE AMP, GENITAL: GC Probe Amp, Genital: NEGATIVE

## 2010-09-05 ENCOUNTER — Inpatient Hospital Stay (INDEPENDENT_AMBULATORY_CARE_PROVIDER_SITE_OTHER)
Admission: RE | Admit: 2010-09-05 | Discharge: 2010-09-05 | Disposition: A | Discharge status: Home / Self Care | Payer: Self-pay | Source: Ambulatory Visit | Attending: Emergency Medicine | Admitting: Emergency Medicine

## 2010-09-05 DIAGNOSIS — M549 Dorsalgia, unspecified: Secondary | ICD-10-CM

## 2010-09-05 DIAGNOSIS — R109 Unspecified abdominal pain: Secondary | ICD-10-CM

## 2010-09-05 LAB — POCT URINALYSIS DIP (DEVICE)
Bilirubin Urine: NEGATIVE
Glucose, UA: NEGATIVE mg/dL
Ketones, ur: NEGATIVE mg/dL
Nitrite: NEGATIVE
pH: 7.5 (ref 5.0–8.0)

## 2010-09-05 LAB — POCT PREGNANCY, URINE: Preg Test, Ur: NEGATIVE

## 2010-09-06 ENCOUNTER — Emergency Department (HOSPITAL_COMMUNITY)
Admission: EM | Admit: 2010-09-06 | Discharge: 2010-09-06 | Disposition: A | Discharge status: Home / Self Care | Payer: Self-pay | Attending: Emergency Medicine | Admitting: Emergency Medicine

## 2010-09-06 ENCOUNTER — Encounter: Payer: Self-pay | Admitting: Sports Medicine

## 2010-09-06 ENCOUNTER — Ambulatory Visit (INDEPENDENT_AMBULATORY_CARE_PROVIDER_SITE_OTHER): Payer: Self-pay | Admitting: Sports Medicine

## 2010-09-06 ENCOUNTER — Ambulatory Visit (HOSPITAL_COMMUNITY)
Admission: RE | Admit: 2010-09-06 | Discharge: 2010-09-06 | Disposition: A | Discharge status: Home / Self Care | Payer: Self-pay | Source: Ambulatory Visit | Attending: Sports Medicine | Admitting: Sports Medicine

## 2010-09-06 ENCOUNTER — Telehealth: Payer: Self-pay | Admitting: Family Medicine

## 2010-09-06 ENCOUNTER — Emergency Department (HOSPITAL_COMMUNITY): Payer: Self-pay

## 2010-09-06 VITALS — BP 94/53 | HR 71 | Temp 97.8°F | Ht 63.0 in | Wt 147.5 lb

## 2010-09-06 DIAGNOSIS — T148XXA Other injury of unspecified body region, initial encounter: Secondary | ICD-10-CM | POA: Insufficient documentation

## 2010-09-06 DIAGNOSIS — R109 Unspecified abdominal pain: Secondary | ICD-10-CM | POA: Insufficient documentation

## 2010-09-06 DIAGNOSIS — R1031 Right lower quadrant pain: Secondary | ICD-10-CM

## 2010-09-06 DIAGNOSIS — J45909 Unspecified asthma, uncomplicated: Secondary | ICD-10-CM | POA: Insufficient documentation

## 2010-09-06 DIAGNOSIS — Z9889 Other specified postprocedural states: Secondary | ICD-10-CM | POA: Insufficient documentation

## 2010-09-06 DIAGNOSIS — X500XXA Overexertion from strenuous movement or load, initial encounter: Secondary | ICD-10-CM | POA: Insufficient documentation

## 2010-09-06 DIAGNOSIS — Z9089 Acquired absence of other organs: Secondary | ICD-10-CM | POA: Insufficient documentation

## 2010-09-06 DIAGNOSIS — R079 Chest pain, unspecified: Secondary | ICD-10-CM | POA: Insufficient documentation

## 2010-09-06 DIAGNOSIS — K3189 Other diseases of stomach and duodenum: Secondary | ICD-10-CM

## 2010-09-06 DIAGNOSIS — K5900 Constipation, unspecified: Secondary | ICD-10-CM | POA: Insufficient documentation

## 2010-09-06 LAB — CBC WITH DIFFERENTIAL/PLATELET
Basophils Absolute: 0.1 K/uL (ref 0.0–0.1)
Basophils Relative: 1 % (ref 0–1)
Eosinophils Absolute: 0.5 K/uL (ref 0.0–0.7)
Eosinophils Relative: 7 % — ABNORMAL HIGH (ref 0–5)
HCT: 34.5 % — ABNORMAL LOW (ref 36.0–46.0)
Hemoglobin: 10.7 g/dL — ABNORMAL LOW (ref 12.0–15.0)
Lymphocytes Relative: 32 % (ref 12–46)
Lymphs Abs: 2.5 K/uL (ref 0.7–4.0)
MCH: 25.1 pg — ABNORMAL LOW (ref 26.0–34.0)
MCHC: 31 g/dL (ref 30.0–36.0)
MCV: 80.8 fL (ref 78.0–100.0)
Monocytes Absolute: 0.8 10*3/uL (ref 0.1–1.0)
Monocytes Relative: 10 % (ref 3–12)
Neutro Abs: 4.1 10*3/uL (ref 1.7–7.7)
Neutrophils Relative %: 51 % (ref 43–77)
Platelets: 384 K/uL (ref 150–400)
RBC: 4.27 MIL/uL (ref 3.87–5.11)
RDW: 16 % — ABNORMAL HIGH (ref 11.5–15.5)
WBC: 8 K/uL (ref 4.0–10.5)

## 2010-09-06 LAB — COMPREHENSIVE METABOLIC PANEL WITH GFR
ALT: 15 U/L (ref 0–35)
AST: 15 U/L (ref 0–37)
Alkaline Phosphatase: 46 U/L (ref 39–117)
CO2: 24 meq/L (ref 19–32)
Creat: 0.61 mg/dL (ref 0.40–1.20)
Sodium: 138 meq/L (ref 135–145)
Total Bilirubin: 0.2 mg/dL — ABNORMAL LOW (ref 0.3–1.2)
Total Protein: 7.2 g/dL (ref 6.0–8.3)

## 2010-09-06 LAB — COMPREHENSIVE METABOLIC PANEL
Albumin: 4.4 g/dL (ref 3.5–5.2)
BUN: 8 mg/dL (ref 6–23)
Calcium: 8.7 mg/dL (ref 8.4–10.5)
Chloride: 105 mEq/L (ref 96–112)
Glucose, Bld: 72 mg/dL (ref 70–99)
Potassium: 4.2 mEq/L (ref 3.5–5.3)

## 2010-09-06 LAB — POCT URINALYSIS DIPSTICK
Nitrite, UA: NEGATIVE
Protein, UA: NEGATIVE
Spec Grav, UA: 1.02
Urobilinogen, UA: 0.2

## 2010-09-06 LAB — LIPASE: Lipase: 23 U/L (ref 0–75)

## 2010-09-06 LAB — POCT H PYLORI SCREEN: H Pylori Screen, POC: NEGATIVE

## 2010-09-06 LAB — POCT UA - MICROSCOPIC ONLY: Epithelial cells, urine per micros: >20

## 2010-09-06 MED ORDER — SENNOSIDES-DOCUSATE SODIUM 8.6-50 MG PO TABS: 2.0000 | ORAL_TABLET | Freq: Two times a day (BID) | ORAL | Status: DC

## 2010-09-06 MED ORDER — SUCRALFATE 1 G PO TABS: 1.0000 g | ORAL_TABLET | Freq: Four times a day (QID) | ORAL | Status: DC

## 2010-09-06 MED ORDER — OMEPRAZOLE MAGNESIUM 20 MG PO TBEC: 40.0000 mg | DELAYED_RELEASE_TABLET | Freq: Every day | ORAL | Status: DC

## 2010-09-06 MED ORDER — CEPHALEXIN 500 MG PO CAPS: 500.0000 mg | ORAL_CAPSULE | Freq: Two times a day (BID) | ORAL | Status: DC

## 2010-09-06 MED ORDER — POLYETHYLENE GLYCOL 3350 17 GM/SCOOP PO POWD: 17.0000 g | Freq: Two times a day (BID) | ORAL | Status: AC

## 2010-09-06 MED ORDER — ACETAMINOPHEN ER 650 MG PO TBCR: 650.0000 mg | EXTENDED_RELEASE_TABLET | Freq: Three times a day (TID) | ORAL | Status: AC | PRN

## 2010-09-06 MED ORDER — IOHEXOL 300 MG/ML  SOLN
100.0000 mL | Freq: Once | INTRAMUSCULAR | Status: AC | PRN
  Administered 2010-09-06: 100 mL via INTRAVENOUS

## 2010-09-06 NOTE — Progress Notes (Signed)
  Subjective:    Patient ID: Stephanie Stanton, female    DOB: 13-Nov-1975, 35 y.o.   MRN: [id number]  HPI 35 yo female with sharp abd pain, RUQ x 3d, radiates to mid back, no N/V/D/C, had some spicy salsa 3d ago, no dysuria, no vag discharge, IUD in place, no fevers/chills, pain is better with food, then worse 45 mins later   Review of Systems    See HPI Objective:   Physical Exam  Constitutional: She appears well-developed and well-nourished. No distress.  Abdominal: Soft. She exhibits no distension and no mass. There is tenderness. There is guarding. There is no rebound.       TTP RLQ and epigastrium.  Normal BS.  Musculoskeletal: She exhibits no edema.  Skin: Skin is warm and dry.          Assessment & Plan:

## 2010-09-06 NOTE — Telephone Encounter (Signed)
Radiologist called as I am cross-cover upper level. Report: no acute abnormalities apart from constipation. Test was to rule out appendicitis, negative per radiology.   Will forward to physician who saw patient and ordered imaging.

## 2010-09-06 NOTE — Assessment & Plan Note (Addendum)
Better initially with food and radiation to back suggestive of duodenal ulcer. RLQ pain on exam suggestive of appendix. Checking CBC, CMET, Lipase, H pylori. CT abd pelvis/PO/IV contrast today. RTC next week to discuss results.  .UA with mod leuks, will Cx urine and add Keflex to regimen.  Marland Kitchen.CT-scan of the abdomen shows significant constipation from rectum to cecum and inspissating into distal ileum, will cont above and add miralax and sennakot-s to regimen.

## 2010-09-06 NOTE — Patient Instructions (Signed)
Checking bloodwork. CT scan of your abdomen. Prilosec 20mg  two tabs daily. Sucralfate 1g tab 4x a day. Come back to see me in a week to discuss results. Go to [address] to get your Cat scan.  Stephanie Stanton. Benjamin Stain, M.D.

## 2010-09-08 LAB — URINE CULTURE
Colony Count: NO GROWTH
Organism ID, Bacteria: NO GROWTH

## 2010-09-14 ENCOUNTER — Encounter: Payer: Self-pay | Admitting: Family Medicine

## 2010-09-14 ENCOUNTER — Ambulatory Visit (INDEPENDENT_AMBULATORY_CARE_PROVIDER_SITE_OTHER): Payer: Self-pay | Admitting: Family Medicine

## 2010-09-14 DIAGNOSIS — D509 Iron deficiency anemia, unspecified: Secondary | ICD-10-CM

## 2010-09-14 DIAGNOSIS — R1031 Right lower quadrant pain: Secondary | ICD-10-CM

## 2010-09-14 DIAGNOSIS — IMO0002 Reserved for concepts with insufficient information to code with codable children: Secondary | ICD-10-CM

## 2010-09-14 DIAGNOSIS — J45909 Unspecified asthma, uncomplicated: Secondary | ICD-10-CM

## 2010-09-14 NOTE — Patient Instructions (Signed)
Bring back stool sample to check for blood Start iron tablets (ferrous sulfate) once to twice per day Follow-up in 6 weeks

## 2010-09-14 NOTE — Progress Notes (Signed)
  Subjective:    Patient ID: Stephanie Stanton, female    DOB: [birth year], 35 y.o.   MRN: [id number]  HPI Abd pain:  Was seen by other provider in office for abd pain.  CT showed constipation.  Patient states fully resolved.  She states having bowel movements twice daily with no medication except fiber supplement  Asthma: using albuterol once or twice per day about every other day.  States health department will take 2-3 more weeeks to get flonase in stock  Anemia:  Patient has not taken iron tablets for anemia in several months.  Denies rectal or other bleeding.  Menses monthly, 4-5 days long, only changes pad 3 times per day.  She used to have heavy menses but none recently.    Review of Systems     Objective:   Physical Exam  Constitutional: She appears well-developed and well-nourished.  Cardiovascular: Normal rate.   Pulmonary/Chest: Effort normal and breath sounds normal.  Abdominal: Soft. She exhibits no distension. There is no tenderness. There is no rebound.          Assessment & Plan:

## 2010-09-14 NOTE — Assessment & Plan Note (Signed)
Reviewed records, Hgb last week at 10.7 is the best it has been.  Thought to be due to iron deficiency anemia in past, source unknown.  CBC shows associated thrombocytosis no longer microcystic but previously was markedly microcytic.  Previous workup has included TSH, per patient report has had normal hemoccult some time ago.  Will ask patient to repeat hemoccult, and advised patient to restart iron supplements.  In 6 weeks will recheck CBC, iron studies, and TSH.  IF all normal and hemoglobin increasing, no further workup.

## 2010-09-14 NOTE — Assessment & Plan Note (Signed)
Will await flovent arrival from [facility].  Will follow-up in 6 weeks to assess control once on medication

## 2010-09-14 NOTE — Assessment & Plan Note (Signed)
Now resolved.  Discussed bowel regimen with patient

## 2010-10-05 LAB — HEMOCCULT GUIAC POC 1CARD (OFFICE): Card #2 Fecal Occult Blod, POC: NEGATIVE

## 2010-10-07 NOTE — Op Note (Signed)
NAMEBERDENA, CISEK                             ACCOUNT NO.:  [id number]   MEDICAL RECORD NO.:  [id number]                   PATIENT TYPE:  OIB   LOCATION:  6735                                 FACILITY:  [facility]   PHYSICIAN:  Gita Kudo, M.D.              DATE OF BIRTH:  Oct 16, 1975   DATE OF PROCEDURE:  06/09/2003  DATE OF DISCHARGE:                                 OPERATIVE REPORT   PREOPERATIVE DIAGNOSIS:  Cholecystitis.   POSTOPERATIVE DIAGNOSIS:  Cholecystitis, gallstones, normal cholangiogram.   OPERATION PERFORMED:  Laparoscopic cholecystectomy with intraoperative  cholangiogram.   SURGEON:  Gita Kudo, M.D.   ASSISTANT:  Sandria Bales. Ezzard Standing, M.D.   ANESTHESIA:  General.   INDICATIONS FOR PROCEDURE:  The patient is a 35 year old female who  presented to the emergency room with abdominal pain.  Work-up showed  gallstones and normal liver function studies.  She has mild anemia.   OPERATIVE FINDINGS:  The gallbladder was thin walled.  There were no  significant adhesions.  The cystic duct was normal in size and the  cholangiogram looked normal.   DESCRIPTION OF PROCEDURE:  Under satisfactory general endotracheal  anesthesia, having received 1 g Ancef preop, the patient's abdomen was  prepped and draped in standard fashion.  0.5% Marcaine was infiltrated at  all skin sites before incision was made.  A transverse incision was made  above the umbilicus, midline opened into the peritoneum, controlled with a  figure-of-eight 0 Vicryl suture.  Operating Hasson port inserted, secured  and good CO2 pneumoperitoneum established.  Then with the camera placed and  under direct vision, two #5 ports were placed laterally and a second #10  medially.  Graspers in the lateral ports gave excellent exposure and the  cystic duct gallbladder junction circumferentially dissected free.  A clip  placed there and an incision made in the duct and percutaneously placed  catheter used to  obtain good cholangiograms.  The catheter withdrawn, duct  controlled with multiple clips and divided.  Cystic artery likewise  circumferentially dissected and divided between clips and then the  gallbladder was removed from below upward using the coagulating spatula for  hemostasis and dissection.  After the gallbladder was removed, it was placed  in an EndoCatch bag because of the small hole made in the gallbladder.  It  was then brought out through the umbilical port after the camera was moved  to the upper port.  There was no spillage and the gallbladder removed intact  with the stones.  Following this, the abdomen was checked for hemostasis,  the operative site lavaged with saline and suctioned dry.  Then CO2  released, ports removed.  The midline closed with a previous figure-of-eight  and a second #0 Vicryl.  The upper medial incision had fascia closed with a  single 0 Vicryl and then subcu was approximated with 4-0 Vicryl and Steri-  Strips for skin.  Sterile dressings were applied and the patient went to the  recovery room from the operating room in good condition.                                               Gita Kudo, M.D.   MRL/MEDQ  D:  06/09/2003  T:  06/10/2003  Job:  [id number]

## 2010-10-07 NOTE — H&P (Signed)
[facility]  Patient:    Stephanie Stanton                          MRN: [id number] Adm. Date:  [id number] Disc. Date: [id number] Attending:  Doneta Public Dictator:   Ebbie Ridge, M.D. CC:         [facility]   History and Physical  CHIEF COMPLAINT:  Asthma exacerbation.  HISTORY OF PRESENT ILLNESS:  This 35 year old Hispanic female with past medical history significant only for asthma and history of pneumonia in 2000, presented to the emergency room complaining of progressively worsening shortness of breath and wheezing for three days.  She developed upper respiratory symptoms (sore throat/cough/congestion) four days ago and was seen by her primary physician two days ago, at which time amoxicillin and Sudafed were started.  Last night, she came to the emergency room for shortness of breath and improved after Albuterol nebulizer treatments x 3.  She was sent home, but continued to have shortness of breath requiring Albuterol x 4 overnight without improvement.  She returned to the emergency room today.  She does report hemoptysis on day #1 of her cough.  Since then, she has had a cough productive of dark yellow sputum.  She also has a runny nose and congestion.  She denies sinus tenderness or pain.  She has had a headache since being in the emergency room.  She complains of pleuritic chest pain. She has had a sick contact; she has a child at home with a URI.  She reports a very sore throat with odynophagia since day #1.  She also has had subjective fever and chills today.  She denies seasonal allergies.  PROBLEM LIST: 1. Asthma exacerbation:    - Diagnosed 13 years ago, exacerbations for three years.    - Hospitalized with exacerbation/pneumonia in 2000.    - No prior intubations. 2. Allergic rhinitis. 3. ASPIRIN allergy.  PAST MEDICAL HISTORY:  See problem list.  MEDICATIONS: 1. Allegra 60 mg p.o. q.d. (The patient takes p.r.n.) 2.  Beclomethasone 80 mg 2 puffs b.i.d. 3. Albuterol MDI 2 puffs q.4h p.r.n. 4. Sudafed 30 mg p.o. q.d., started this two days ago. 5. Amoxicillin 500 mg p.o. t.i.d., started two days ago.  ALLERGIES:  ASPIRIN causes wheezing.  SOCIAL HISTORY:  The patient lives with her husband and three children and a sister in a [place] mobile home.  She has a fourth child who lives in [place].  She moved to the [place] seven years ago.  She works as a Agricultural engineer person.  She does not have carpet in her home, but she does have blinds.  She has an air purifier at home.  She denies tobacco, alcohol, or drug use.  FAMILY HISTORY:  Positive for diabetes mellitus, she has an aunt with asthma, otherwise negative.  REVIEW OF SYSTEMS:  The patient denies weight loss or gain, fatigue, abdominal pain, nausea, vomiting, diarrhea, dysuria/hematuria.  Review of systems is otherwise negative, except as included in the HPI.  PHYSICAL EXAMINATION:  VITAL SIGNS:  Temperature 99, pulse 109, blood pressure 121/59, respiratory rate 32, pulse oximetry 90% on room air.  GENERAL:  This is a pleasant young Hispanic female in no acute distress.  HEENT:  Head is normocephalic and atraumatic.  Pupils are equally round and reactive to light.  Extraocular movements are intact.  Tympanic membranes are clear bilaterally.  Nares are clear with mild clear drainage.  Mucosa is boggy.  Oropharynx is pink, without erythema.  Tonsils are mildly enlarged.  NECK:  Supple, with mild lymphadenopathy.  LUNGS:  There is good air movement bilaterally, clear breath sounds are the bases, inspiratory and expiratory wheezes diffusely.  CARDIOVASCULAR:  Tachycardic regular rhythm without murmur, gallop, or rub; strong peripheral pulses.  ABDOMEN:  Soft and nontender.  Nondistended.  Normoactive bowel sounds.  No masses.  Positive naval ring.  NEUROLOGICAL:  Cranial nerves 3-12 are intact.  Deep tendon reflexes are  2+ throughout.  Strength is 4+ out of 5 in all four extremities.  Sensation is intact.  The patient is alert and oriented x 4.  SKIN:  Dry and warm, without rashes or lesions.  LABORATORY DATA:  White count is 9.4, with 73% neutrophils, 16% lymphocytes. Hemoglobin 13.2, platelets 338.  Chest x-ray revealed hyperinflation with increased perihilar markings.  ASSESSMENT AND PLAN:  Ms. Stephanie Stanton is a 62 year old Hispanic female with asthma exacerbation.  This is likely triggered by a viral upper respiratory infection versus pneumonia.  There is the possibility of a bacterial bronchitis or atypical pneumonia, but this is less likely without fever and a normal white blood count.  We will admit her to the family practice teaching service, give IV steroids for one day given poor response to oral steroids thus far.  Tomorrow, we will reassess and if she has improved we can decrease and change to oral steroids. Continue albuterol every two hours scheduled and every one hour as needed overnight.  In the morning, or one wheezing improves, we can change to every four hours.  Continue amoxicillin.  We can consider adding Zithromax for atypical coverage.  The patient may benefit from the addition of Singulair as an outpatient.  We will ask the family practice pharmacist to assist with asthma medications and education. DD:  09/30/00 TD:  10/01/00 Job: [id number]

## 2010-10-21 ENCOUNTER — Other Ambulatory Visit: Payer: Self-pay | Admitting: Family Medicine

## 2010-11-08 NOTE — Progress Notes (Signed)
Addended by: [place], Marcell Pfeifer on: 11/08/2010 06:57 PM   Modules accepted: Orders

## 2010-11-09 ENCOUNTER — Other Ambulatory Visit: Payer: Self-pay | Admitting: Family Medicine

## 2010-11-09 ENCOUNTER — Encounter: Payer: Self-pay | Admitting: Family Medicine

## 2010-11-09 MED ORDER — ALBUTEROL SULFATE HFA 108 (90 BASE) MCG/ACT IN AERS: 2.0000 | INHALATION_SPRAY | RESPIRATORY_TRACT | Status: DC | PRN

## 2010-11-25 ENCOUNTER — Telehealth: Payer: Self-pay | Admitting: Family Medicine

## 2010-11-25 NOTE — Telephone Encounter (Signed)
[facility]

## 2010-11-28 NOTE — Telephone Encounter (Signed)
I called in to the pharmacy and left message for iron rx. Please inform patient she needs to make sure she takes daily miralax or stool softener as iron may exacerbate her constipation. thanks

## 2010-11-28 NOTE — Telephone Encounter (Signed)
Attempted to call patient, no answer, mailbox not set up

## 2010-11-29 NOTE — Telephone Encounter (Signed)
Attempted to call patient again. 

## 2010-12-05 ENCOUNTER — Ambulatory Visit (INDEPENDENT_AMBULATORY_CARE_PROVIDER_SITE_OTHER): Payer: Self-pay | Admitting: Family Medicine

## 2010-12-05 ENCOUNTER — Encounter: Payer: Self-pay | Admitting: Family Medicine

## 2010-12-05 VITALS — BP 100/61 | HR 74 | Temp 98.1°F | Ht 63.5 in | Wt 146.3 lb

## 2010-12-05 DIAGNOSIS — D509 Iron deficiency anemia, unspecified: Secondary | ICD-10-CM

## 2010-12-05 DIAGNOSIS — Z309 Encounter for contraceptive management, unspecified: Secondary | ICD-10-CM

## 2010-12-05 NOTE — Patient Instructions (Signed)
Nice to see you! Your IUD is in the proper place. Schedule next appointment as needed.

## 2010-12-05 NOTE — Progress Notes (Signed)
  Subjective:    Patient ID: Stephanie Stanton, female    DOB: Dec 11, 1975, 35 y.o.   MRN: [id number]  HPI  1. IUD. Patient is happy with contraception. Did feel strings after initial insertion, but has not felt them despite attempts in the past week. ALso notes that her husband felt them previously, now doesn't. Denies discharge, abnormal bleeding, pelvic pain.   Review of Systems See HPI. Otherwise negative    Objective:   Physical Exam  Vitals reviewed. Constitutional: She appears well-developed and well-nourished. No distress.  Genitourinary: Vagina normal and uterus normal. No vaginal discharge found.       IUD strings visualized at cervical os. No abnormalities.          Assessment & Plan:

## 2010-12-05 NOTE — Assessment & Plan Note (Signed)
IUD securely in place (placed 3 months ago) and patient is happy with contraception. Encouraged to continue routine manual check of strings. Return to care prn.

## 2010-12-05 NOTE — Assessment & Plan Note (Signed)
Patient taking iron 3 days ago. Will make lab appt to recheck CBC, TSH, iron panel to further characterize this.

## 2011-01-09 ENCOUNTER — Other Ambulatory Visit: Payer: Self-pay | Admitting: Family Medicine

## 2011-01-09 MED ORDER — FLUTICASONE PROPIONATE HFA 220 MCG/ACT IN AERO: 1.0000 | INHALATION_SPRAY | Freq: Two times a day (BID) | RESPIRATORY_TRACT | Status: DC

## 2011-02-22 LAB — CBC
Hemoglobin: 8.4 — ABNORMAL LOW
MCHC: 30.2
RBC: 4.55
RDW: 20.3 — ABNORMAL HIGH

## 2011-02-22 LAB — MONONUCLEOSIS SCREEN: Mono Screen: NEGATIVE

## 2011-02-22 LAB — BASIC METABOLIC PANEL
CO2: 24
GFR calc non Af Amer: >60
Glucose, Bld: 93
Potassium: 3.7
Sodium: 136

## 2011-02-22 LAB — DIFFERENTIAL
Basophils Relative: 2 — ABNORMAL HIGH
Eosinophils Absolute: 0.2
Lymphocytes Relative: 9 — ABNORMAL LOW
Monocytes Absolute: 0.6
Neutrophils Relative %: 84 — ABNORMAL HIGH
Smear Review: INCREASED

## 2011-06-28 ENCOUNTER — Other Ambulatory Visit: Payer: Self-pay | Admitting: Family Medicine

## 2011-06-28 MED ORDER — FLUTICASONE PROPIONATE HFA 220 MCG/ACT IN AERO: 1.0000 | INHALATION_SPRAY | Freq: Two times a day (BID) | RESPIRATORY_TRACT | Status: DC

## 2011-10-26 ENCOUNTER — Other Ambulatory Visit (HOSPITAL_COMMUNITY)
Admission: RE | Admit: 2011-10-26 | Discharge: 2011-10-26 | Disposition: A | Discharge status: Home / Self Care | Payer: Self-pay | Source: Ambulatory Visit | Attending: Family Medicine | Admitting: Family Medicine

## 2011-10-26 ENCOUNTER — Ambulatory Visit (INDEPENDENT_AMBULATORY_CARE_PROVIDER_SITE_OTHER): Payer: Self-pay | Admitting: Family Medicine

## 2011-10-26 ENCOUNTER — Encounter: Payer: Self-pay | Admitting: Family Medicine

## 2011-10-26 VITALS — BP 93/59 | HR 74 | Temp 97.2°F | Ht 63.5 in | Wt 145.2 lb

## 2011-10-26 DIAGNOSIS — R1030 Lower abdominal pain, unspecified: Secondary | ICD-10-CM

## 2011-10-26 DIAGNOSIS — Z124 Encounter for screening for malignant neoplasm of cervix: Secondary | ICD-10-CM

## 2011-10-26 DIAGNOSIS — Z113 Encounter for screening for infections with a predominantly sexual mode of transmission: Secondary | ICD-10-CM | POA: Insufficient documentation

## 2011-10-26 DIAGNOSIS — D509 Iron deficiency anemia, unspecified: Secondary | ICD-10-CM

## 2011-10-26 DIAGNOSIS — N760 Acute vaginitis: Secondary | ICD-10-CM

## 2011-10-26 DIAGNOSIS — Z01419 Encounter for gynecological examination (general) (routine) without abnormal findings: Secondary | ICD-10-CM | POA: Insufficient documentation

## 2011-10-26 DIAGNOSIS — R109 Unspecified abdominal pain: Secondary | ICD-10-CM

## 2011-10-26 LAB — CBC
MCHC: 30.2 g/dL (ref 30.0–36.0)
Platelets: 442 10*3/uL — ABNORMAL HIGH (ref 150–400)
RDW: 17.3 % — ABNORMAL HIGH (ref 11.5–15.5)
WBC: 6.1 10*3/uL (ref 4.0–10.5)

## 2011-10-26 LAB — POCT WET PREP (WET MOUNT)

## 2011-10-26 MED ORDER — FERROUS SULFATE 325 (65 FE) MG PO TABS: 325.0000 mg | ORAL_TABLET | Freq: Three times a day (TID) | ORAL | Status: DC

## 2011-10-26 NOTE — Patient Instructions (Signed)
Will check tests for infection and pap smear today. WIll check blood counts and iron level. I will send prescription for iron to take since this is probably low. I will call with results in 2-3 days.  Dficit de hierro Anemia (Iron Deficiency Anemia) Existen diferentes tipos de anemia. La anemia por dficit de hierro es la ms comn. La anemia por deficiencia de hierro es una disminucin en la cantidad de glbulos rojos producida por una baja cantidad de hierro. Sin la suficiente cantidad de hierro, su cuerpo no puede producir suficiente hemoglobina. La hemoglobina es una sustancia presente en los glbulos rojos que lleva oxgeno a los tejidos del cuerpo. La anemia por deficiencia de hierro puede producirle cansancio y falta de aire. CAUSAS Falta de hierro en la dieta.  Esto puede observarse en bebs y nios, debido a la poca cantidad de Dance movement psychotherapist.  Puede observarse en adultos que no consumen alimentos ricos en hierro.  Puede observarse en mujeres embarazadas o que amamantan, que no toman suplementos de hierro. Hay una mayor necesidad de consumo de hierro en estos momentos.  Pobre absorcin de hierro, como ocurre [employer] trastornos intestinales, como la remocin [place] del intestino delgado o en un bypass intestinal.  Sangrado intestinal.  Perodos menstruales profusos.  SNTOMAS Cuando la anemia es leve puede pasar inadvertida. Los sntomas pueden ser: Alma Friendly.  Dolor de [place].  Palidez.  Debilidad.  Falta de aire.  Mareos.  Manos y pies fros.  Latidos cardacos irregulares o acelerados.  DIAGNSTICO El diagnstico requiere una evaluacin rigurosa y un examen fsico por parte del profesional que lo asiste. Los exmenes sanguneos se utilizan generalmente para confirmar una anemia por dficit de hierro.  Podrn realizarse pruebas adicionales para encontrar la causa subyacente de su anemia. Pueden incluir:  El anlisis de sangre en la materia fecal (examen de sangre oculta  en heces).  Un procedimiento para observar dentro del colon y el recto (colonoscopa).  Un procedimiento para observar dentro del esfago y Investment banker, corporate (endoscopa).  TRATAMIENTO Corregir la causa del dficit de hierro es Secretary/administrator.  Ciertos medicamentos, como los anticonceptivos orales, pueden hacer que el flujo menstrual sea ms leve.  Pueden utilizarse antibiticos y otros medicamentos para tratar lceras ppticas.  Puede ser Lois Huxley ciruga para remover un plipo que sangra, un tumor o un fibroide.  A menudo, se deben tomar suplementos de hierro (sulfato ferroso).  Para una mejor absorcin del hierro, tome estos suplementos con el estmago vaco.  Es posible que deba tomar los suplementos con alimentos si no puede tolerarlos con el estmago vaco. La vitamina C mejora la absorcin de hierro. El profesional que lo asiste podr recomendarle que tome sus tabletas de hierro con un vaso de jugo de naranja o suplemento de vitamina C.  No debe tomar [employer] o anticidos al mismo tiempo que los suplementos de hierro. Esto podra interferir con la absorcin de hierro.  Los suplementos de hierro pueden causar constipacin. A menudo se recomienda un ablandador de heces.  Las mujeres embarazadas o que amamantan debern tomar suplementos de hierro, porque su dieta normal a menudo no provee la cantidad necesaria.  Los pacientes que no pueden Engineer, technical sales hierro [employer] lo pueden recibir por Paramedic, o por medio de una inyeccin en el msculo.  INSTRUCCIONES PARA EL CUIDADO DOMICILIARIO El dietista o el profesional que lo asiste podrn ayudarla con las dudas relacionadas con la dieta.  Tome hierro y vitaminas segn se lo ha prescrito  el profesional que lo asiste.  Lleve una dieta rica en hierro. Consuma hgado, carne magra, pan integral, huevos, frutos secos, y vegetales de [employer].  SOLICITE ATENCIN MDICA DE INMEDIATO SI: Tiene un episodio de desmayo. No conduzca.Llame (911 en  los [place]) o al servicio de emergencias local si no dispone de otro tipo de [place].  Siente dolor en el pecho, nuseas (ganas de vomitar) o vmitos.  Presenta una dificultad respiratoria grave al Arts development officer.  Se siente dbil o con mucha sed.  Tiene pulso y ritmo cardaco acelerados.  Presenta una sudoracin imprevista o se siente mareado al levantarse de la cama o de la silla.  ASEGRESE QUE: Comprende estas instrucciones.  Controlar su enfermedad.  Solicitar ayuda inmediatamente si no mejora o si empeora.  Document Released: 05/08/2005 Document Revised: 04/27/2011 [facility] Patient Information 2012 [place], [place].

## 2011-10-27 LAB — IBC PANEL
%SAT: 4 % — ABNORMAL LOW (ref 20–55)
TIBC: 403 ug/dL (ref 250–470)
UIBC: 386 ug/dL (ref 125–400)

## 2011-10-27 LAB — BASIC METABOLIC PANEL
BUN: 6 mg/dL (ref 6–23)
Calcium: 8.6 mg/dL (ref 8.4–10.5)
Creat: 0.48 mg/dL — ABNORMAL LOW (ref 0.50–1.10)
Glucose, Bld: 97 mg/dL (ref 70–99)

## 2011-10-27 LAB — TSH: TSH: 0.835 u[IU]/mL (ref 0.350–4.500)

## 2011-10-27 LAB — IRON: Iron: 17 ug/dL — ABNORMAL LOW (ref 42–145)

## 2011-10-28 DIAGNOSIS — R1030 Lower abdominal pain, unspecified: Secondary | ICD-10-CM | POA: Insufficient documentation

## 2011-10-28 NOTE — Assessment & Plan Note (Addendum)
Symptoms with bleeding after intercourse concerning for PID or cervicitis. Less likely uterine, UTI, GI etiology. Check wet prep, cervical cultures, pap smear due today. If work up is negative will consider pelvic US as IUD is risk for intrauterine abnormality.

## 2011-10-28 NOTE — Progress Notes (Signed)
  Subjective:    Patient ID: Stephanie Stanton, female    DOB: 1975/08/01, 36 y.o.   MRN: [id number]  HPI  1. Anemia. Diagnosed several years ago, patient states she was told due to iron deficiency but never a source. She took iron replacement but has not been treating herself in some time. Feels some fatigue and thinks she needs level checked today. She does not feel that her periods are heavy, they are regular. She denies hematochezia, hematuria, acid reflux, diarrhea, constipation.  2. Lower abdominal discomfort. For past several months has some discomfort, cramping in lower abdomen/pelvis.Not worsening. Also some bleeding and pain with intercourse. Had IUD placed about one year ago. Married and monogamous. Denies dysuria, discharge, heavy periods, emesis, nausea, weight loss.   Review of Systems See HPI otherwise negative.  reports that she has never smoked. She does not have any smokeless tobacco history on file.    Objective:   Physical Exam  Vitals reviewed. Constitutional: She is oriented to person, place, and time. She appears well-developed and well-nourished. No distress.  HENT:  Head: Normocephalic and atraumatic.  Mouth/Throat: Oropharynx is clear and moist.  Eyes: EOM are normal. Pupils are equal, round, and reactive to light.  Neck: Neck supple. No thyromegaly present.  Cardiovascular: Normal rate, regular rhythm and normal heart sounds.   No murmur heard. Pulmonary/Chest: Effort normal and breath sounds normal.  Abdominal: Soft. Bowel sounds are normal. She exhibits no distension. There is no tenderness. There is no rebound and no guarding.  Genitourinary: Vagina normal and uterus normal. No vaginal discharge found.       Cervix appears normal, IUD strings visualized. No cervical motion tenderness, uterus wnl.  Musculoskeletal: Normal range of motion. She exhibits no edema and no tenderness.  Neurological: She is alert and oriented to person, place, and time. Coordination normal.   Skin: No rash noted. She is not diaphoretic.  Psychiatric: She has a normal mood and affect.        Assessment & Plan:

## 2011-10-28 NOTE — Assessment & Plan Note (Signed)
CBC and indices indicate worsening of iron deficiency and anemia recurrence. This previously responded to trial of oral iron replacement. Given the degree of anemia and patient's doubting of menstrual cause, will check stool guiuac cards. Consider GI referral if positive. Restart iron replacement TID. Check levels in 4-6 weeks.

## 2011-11-01 ENCOUNTER — Ambulatory Visit (HOSPITAL_COMMUNITY)
Admission: RE | Admit: 2011-11-01 | Discharge: 2011-11-01 | Disposition: A | Discharge status: Home / Self Care | Payer: Self-pay | Source: Ambulatory Visit | Attending: Family Medicine | Admitting: Family Medicine

## 2011-11-01 ENCOUNTER — Encounter: Payer: Self-pay | Admitting: Family Medicine

## 2011-11-01 ENCOUNTER — Ambulatory Visit (INDEPENDENT_AMBULATORY_CARE_PROVIDER_SITE_OTHER): Payer: Self-pay | Admitting: Family Medicine

## 2011-11-01 VITALS — BP 100/66 | HR 73 | Ht 63.5 in | Wt 146.0 lb

## 2011-11-01 DIAGNOSIS — J45901 Unspecified asthma with (acute) exacerbation: Secondary | ICD-10-CM

## 2011-11-01 DIAGNOSIS — IMO0002 Reserved for concepts with insufficient information to code with codable children: Secondary | ICD-10-CM

## 2011-11-01 DIAGNOSIS — R05 Cough: Secondary | ICD-10-CM

## 2011-11-01 DIAGNOSIS — R059 Cough, unspecified: Secondary | ICD-10-CM | POA: Insufficient documentation

## 2011-11-01 DIAGNOSIS — J45909 Unspecified asthma, uncomplicated: Secondary | ICD-10-CM

## 2011-11-01 DIAGNOSIS — R062 Wheezing: Secondary | ICD-10-CM | POA: Insufficient documentation

## 2011-11-01 DIAGNOSIS — R0602 Shortness of breath: Secondary | ICD-10-CM | POA: Insufficient documentation

## 2011-11-01 MED ORDER — METHYLPREDNISOLONE SODIUM SUCC 125 MG IJ SOLR
125.0000 mg | Freq: Once | INTRAMUSCULAR | Status: AC
  Administered 2011-11-01: 125 mg via INTRAMUSCULAR

## 2011-11-01 MED ORDER — IPRATROPIUM BROMIDE 0.02 % IN SOLN
0.5000 mg | Freq: Once | RESPIRATORY_TRACT | Status: AC
  Administered 2011-11-01: 0.5 mg via RESPIRATORY_TRACT

## 2011-11-01 MED ORDER — ALBUTEROL SULFATE (2.5 MG/3ML) 0.083% IN NEBU
2.5000 mg | INHALATION_SOLUTION | Freq: Once | RESPIRATORY_TRACT | Status: AC
  Administered 2011-11-01: 2.5 mg via RESPIRATORY_TRACT

## 2011-11-01 MED ORDER — PREDNISONE 50 MG PO TABS: ORAL_TABLET | ORAL | Status: AC

## 2011-11-01 NOTE — Assessment & Plan Note (Signed)
URI induced exacerbation. Solumedrol 125 IM x 1. Duoneb x 1 in clinic. CXR to r/o infiltrate as pt reports hx/o PNA in the past.  Prednisone.  If cxr positive, doxy vs. Azithro.  Resp red flags discussed.  Handout given.  Follow up with PCP in 1 week.

## 2011-11-01 NOTE — Patient Instructions (Signed)
Follow up with Dr. Cristal Ford in 1-2 weeks.  Prevencin del asma (Asthma Prevention) El humo del cigarrillo, el polvo domstico, los hongos, el polen, la descamacin de los [place], algunos insectos, la actividad fsica y Honeygo aire frio pueden ser disparadores de un episodio de asma. En algunos casos, la causa no se identifica.  Para disminuir la frecuencia de los ataques, siga los siguientes consejos que podr Education officer, environmental en su casa:  Evite el humo del cigarrillo o el que provenga de otras fuentes. No permita que nadie fume en la casa que habita un enfermo de asma. Si se permite fumar en el interior, deber hacerse en una habitacin en la que se cierre la puerta. Posteriormente, deber abrirse una ventana para ventilar el aire. En lo posible, no utilice un horno a lea, una estufa a querosene o un hogar de lea. Minimice la exposicin a toda fuente de humo, inclusive incienso, velas, fogatas o fuegos artificiales.   Disminuya la exposicin al polvo. Mantenga las ventanas cerradas y [employer] aire acondicionado central durante la temporada de alergia al polen. En lo posible, permanezca dentro de la habitacin con las ventanas cerradas desde las ltimas horas de la maana hasta la tarde. Evite cortar el csped si el polen le causa alergia. Mdese de ropa y tome una ducha despus de estar en el exterior durante esta poca del ao.   Elimine el moho de los baos y zonas hmedas. Hgalo limpiando los pisos con un fungicida o con lavandina diluida. Evite el uso de humidificadores, vaporizadores o refrigerantes hmedos. Pueden diseminar el moho a travs del aire. Arregle las caeras que pierdan agua u otras fuentes de agua que tengan moho alrededor.   Disminuya la exposicin al polvo de la casa. Hgalo dejando los pisos desocupados, aspirndolos con frecuencia y [employer] filtros de las calderas y los acondicionadores de aire con Psychologist, clinical. Evite el uso de elementos de cama de plumas, lana o espuma de  goma. Utilice almohadas de Equities trader y cobertores de [employer] colchones. Lave la ropa de cama semanalmente en agua caliente (ms caliente que 130 F).   Trate de pedirle a otra persona que realice el aspirado una o dos veces por semana. Permanezca fuera de las habitaciones mientras son aspiradas y por algn tiempo despus. Si el aspirado lo realiza usted, use una mascarilla para el polvo (consgala en una ferretera), Neomia Dear bolsa para la aspiradora doble o de [place], o una aspiradora con un filtro HEPA.   Evite los perfumes, el talco, el aerosol para el cabello, las pinturas y otros olores y vapores intensos.   Mantenga a las [address] de [address] caliente (gatos, perros, roedores, Engineer, mining) fuera de su casa, si ellos le provocan el asma. Si no puede mantener a las Engineer, mining, Engineer, water fuera de la habitacin y de otras reas en las que duerme, y Dietitian la puerta cerrada. Quite de su casa las alfombras y los muebles cubiertos con telas. Si eso no es posible, [address] las mascotas lejos de los muebles cubiertos de tela y de las alfombras.   Elimine las cucarachas. Mantenga los alimentos y las bebidas en contenedores cerrados. Nunca deje comida a la vista. Use venenos, tramperas polvos, geles o pastas (por ejemplo cido brico). Si [place] un aerosol para exterminar las cucarachas, permanezca fuera de la habitacin hasta que el olor desaparezca.   Disminuya la humedad del ambiente a menos del 60%. Use un purificador de aire.   Evite los sulfitos en alimentos y  bebidas. No beba cerveza ni vino, ni consuma frutas secas, patatas procesadas o langostinos, si estos le producen sntomas de asma.   Evite el aire fro. Cbrase la nariz y la boca con una bufanda en [address] fros o ventosos.   No consuma aspirina. Este es el medicamento que con ms frecuencia causa ataques de asma.   Si la actividad fsica le desencadena un ataque de asma, consulte a su mdico como debe prepararse antes  de hacer ejercicio. (Por ejemplo, pregunte si puede usar su inhalador 10 minutos antes de la [place]).   Evite el contacto cercano con personas que estn resfriadas o tienen gripe, ya que los sntomas de asma pueden empeorar si se contagia la infeccin. Lvese las manos cuidadosamente despus de tocar objetos manipulados por otras personas que sufren infecciones respiratorias.   Aplquese la vacuna contra la gripe todos los aos para protegerse contra el virus de la gripe, lo que con frecuencia hace que el asma empeore durante das o semanas. Tambin aplquese la vacuna contra la neumona una vez cada 5  10 aos.  Comunquese con su mdico si necesita ms informacin acerca de las medidas que puede tomar para prevenir los ataques de asma. Document Released: 05/08/2005 Document Revised: 04/27/2011 [facility] Patient Information 2012 [place], [place].

## 2011-11-01 NOTE — Progress Notes (Signed)
  Subjective:    Patient ID: Stephanie Stanton, female    DOB: 1975/12/15, 36 y.o.   MRN: [id number]  HPI Increased WOB and wheezing x 2 days.  Baseline hx/o asthma.  Pt has had to use albuterol 3-4 times last night. Usually does not use.  Also with URI sxs including rhinorrhea, nasal congestion, generalized malaise, chills.  + sick contact in son with similar sxs.  Also with mild SOB.    Review of Systems See HPI, otherwise ROS negative     Objective:   Physical Exam Gen: up in chair, NAD HEENT: NCAT, EOMI, TMs clear bilaterally, +nasal erythema, rhinorrhea bilaterally, + post oropharyngeal erythema  CV: RRR, no murmurs auscultated PULM: good overall air movement, + inspiratory and expiratory wheezes bilaterally ABD: S/NT/+ bowel sounds  EXT: 2+ peripheral pulses   Assessment & Plan:

## 2011-11-27 ENCOUNTER — Ambulatory Visit (INDEPENDENT_AMBULATORY_CARE_PROVIDER_SITE_OTHER): Payer: Self-pay | Admitting: Family Medicine

## 2011-11-27 ENCOUNTER — Encounter: Payer: Self-pay | Admitting: Family Medicine

## 2011-11-27 VITALS — BP 82/60 | HR 78 | Temp 98.5°F | Ht 63.0 in | Wt 146.9 lb

## 2011-11-27 DIAGNOSIS — J45909 Unspecified asthma, uncomplicated: Secondary | ICD-10-CM

## 2011-11-27 DIAGNOSIS — IMO0002 Reserved for concepts with insufficient information to code with codable children: Secondary | ICD-10-CM

## 2011-11-27 DIAGNOSIS — J309 Allergic rhinitis, unspecified: Secondary | ICD-10-CM

## 2011-11-27 DIAGNOSIS — D509 Iron deficiency anemia, unspecified: Secondary | ICD-10-CM

## 2011-11-27 LAB — RETICULOCYTES
ABS Retic: 55.9 10*3/uL (ref 19.0–186.0)
RBC.: 4.66 MIL/uL (ref 3.87–5.11)

## 2011-11-27 LAB — CBC
HCT: 34.5 % — ABNORMAL LOW (ref 36.0–46.0)
RBC: 4.66 MIL/uL (ref 3.87–5.11)
RDW: 22.3 % — ABNORMAL HIGH (ref 11.5–15.5)
WBC: 9.9 10*3/uL (ref 4.0–10.5)

## 2011-11-27 NOTE — Assessment & Plan Note (Addendum)
Etiology remains unclear as patient denies any menorrhagia, perhaps just dietary deficiency. Will recheck CBC, indices and reticulocyte count. Repeat hemaccult cards as it has been > 50yr. Has negative pap test last month. Will repeat pelvic US to rule out any endometrial stripe abnormalities that may indicate malignancy (had an [place] 2 years ago with question of adenomyosis after her miscarriage). If hemoglobin has improved at least 1-2 points in past month, will continue iron replacement and conservative therapy. F/u hgb in 2-3 months.

## 2011-11-27 NOTE — Progress Notes (Signed)
  Subjective:    Patient ID: Stephanie Stanton, female    DOB: 05/20/1976, 36 y.o.   MRN: [id number]  HPI  1. Iron deficiency anemia. Taking iron 2-3 times daily. Eating foods with high iron concentration. States she has improved energy now. Her only incidences of known bleeding are her light menstrual cycles, and she still notes some spotting after intercourse.   Lab studies strongly indicative of iron deficiency-this previously improved with oral replacement.  Has an IUD and history of 2 x miscarriages associated with heavy bleeding 2-3 years ago. Reviewed negative results: FOB last year was normal, normal pap smear.  Denies any blood in stool, heavy cycles, syncope, bruising, abdominal pain.   2. Allergic rhinitis. Currently asymptomatic. Not taking flonase.  3. Asthma. Asymptomatic. Only using prn albuterol and none needed recently. Is not taking controller medication. Denies cough, dypspnea, wheezing, chest pain.  Review of Systems See HPI otherwise negative.  reports that she has never smoked. She does not have any smokeless tobacco history on file.     Objective:   Physical Exam  Vitals reviewed. Constitutional: She is oriented to person, place, and time. She appears well-developed and well-nourished. No distress.  HENT:  Head: Normocephalic and atraumatic.  Mouth/Throat: Oropharynx is clear and moist.  Eyes: EOM are normal. Pupils are equal, round, and reactive to light.  Neck: Neck supple.  Cardiovascular: Normal rate, regular rhythm, normal heart sounds and intact distal pulses.   No murmur heard. Pulmonary/Chest: Effort normal and breath sounds normal. No respiratory distress. She has no wheezes. She has no rales.  Abdominal: Soft. She exhibits no distension. There is no tenderness. There is no rebound and no guarding.  Musculoskeletal: She exhibits no edema and no tenderness.  Lymphadenopathy:    She has no cervical adenopathy.  Neurological: She is alert and oriented to person,  place, and time. She exhibits normal muscle tone. Coordination normal.  Skin: No rash noted. She is not diaphoretic.  Psychiatric: She has a normal mood and affect.       Assessment & Plan:

## 2011-11-27 NOTE — Assessment & Plan Note (Signed)
Stable. Patient has self-discontinued her flovent. Discussed risks. Continues albuterol prn.

## 2011-11-27 NOTE — Assessment & Plan Note (Signed)
Asymptomatic, has seasonal variation. Has discontinued flonase and zyrtec.

## 2011-11-27 NOTE — Patient Instructions (Addendum)
You have severe iron deficiency. Will recheck your blood counts today. I would like to check an ultrasound to make sure no reason for your bleeding after intercourse and anemia. Would like to recheck your stool for blood also. Your pap test was normal. Continue taking iron for now. I will call with results. Make an appointment in 2 months for check up.  Iron-Rich Diet An iron-rich diet contains foods that are good sources of iron. Iron is an important mineral that helps your body produce hemoglobin. Hemoglobin is a protein in red blood cells that carries oxygen to the body's tissues. Sometimes, the iron level in your blood can be low. This may be caused by:  A lack of iron in your diet.   Blood loss.   Times of growth, such as during pregnancy or during a child's growth and development.  Low levels of iron can cause a decrease in the number of red blood cells. This can result in iron deficiency anemia. Iron deficiency anemia symptoms include:  Tiredness.   Weakness.   Irritability.   Increased chance of infection.  Here are some recommendations for daily iron intake:  Males older than 38 years of age need 8 mg of iron per day.   Women ages 62 to 66 need 18 mg of iron per day.   Pregnant women need 27 mg of iron per day, and women who are over 73 years of age and breastfeeding need 9 mg of iron per day.   Women over the age of 45 need 8 mg of iron per day.  SOURCES OF IRON There are 2 types of iron that are found in food: heme iron and nonheme iron. Heme iron is absorbed by the body better than nonheme iron. Heme iron is found in meat, poultry, and fish. Nonheme iron is found in grains, beans, and vegetables. Heme Iron Sources Food / Iron (mg)  Chicken liver, 3 oz (85 g)/ 10 mg   Beef liver, 3 oz (85 g)/ 5.5 mg   Oysters, 3 oz (85 g)/ 8 mg   Beef, 3 oz (85 g)/ 2 to 3 mg   Shrimp, 3 oz (85 g)/ 2.8 mg   [place], 3 oz (85 g)/ 2 mg   Chicken, 3 oz (85 g) / 1 mg   Fish  (tuna, halibut), 3 oz (85 g)/ 1 mg   Pork, 3 oz (85 g)/ 0.9 mg  Nonheme Iron Sources Food / Iron (mg)  Ready-to-eat breakfast cereal, iron-fortified / 3.9 to 7 mg   Tofu,  cup / 3.4 mg   Kidney beans,  cup / 2.6 mg   Baked potato with skin / 2.7 mg   Asparagus,  cup / 2.2 mg   Avocado / 2 mg   Dried peaches,  cup / 1.6 mg   Raisins,  cup / 1.5 mg   Soy milk, 1 cup / 1.5 mg   Whole-wheat bread, 1 slice / 1.2 mg   Spinach, 1 cup / 0.8 mg   Broccoli,  cup / 0.6 mg  IRON ABSORPTION Certain foods can decrease the body's absorption of iron. Try to avoid these foods and beverages while eating meals with iron-containing foods:  Coffee.   Tea.   Fiber.   Soy.  Foods containing vitamin C can help increase the amount of iron your body absorbs from iron sources, especially from nonheme sources. Eat foods with vitamin C along with iron-containing foods to increase your iron absorption. Foods that are high in  vitamin C include many fruits and vegetables. Some good sources are:  Fresh orange juice.   Oranges.   Strawberries.   Mangoes.   Grapefruit.   Red bell peppers.   Green bell peppers.   Broccoli.   Potatoes with skin.   Tomato juice.  Document Released: 12/20/2004 Document Revised: 04/27/2011 Document Reviewed: 10/27/2010 [facility] Patient Information 2012 [employer], [place].

## 2011-11-28 ENCOUNTER — Telehealth: Payer: Self-pay | Admitting: Family Medicine

## 2011-11-28 LAB — HIV ANTIBODY (ROUTINE TESTING W REFLEX): HIV: NONREACTIVE

## 2011-11-28 NOTE — Telephone Encounter (Signed)
Spoke with patient regarding improved hemoglobin with iron replacement. Advised she continue at least 1-2 tabs iron daily. Will recheck in 2-3 months. She will have a pelvic US tomorrow to rule out concern for malignancy.

## 2011-11-29 ENCOUNTER — Ambulatory Visit (HOSPITAL_COMMUNITY)
Admission: RE | Admit: 2011-11-29 | Discharge: 2011-11-29 | Disposition: A | Discharge status: Home / Self Care | Payer: Self-pay | Source: Ambulatory Visit | Attending: Family Medicine | Admitting: Family Medicine

## 2011-11-29 DIAGNOSIS — IMO0002 Reserved for concepts with insufficient information to code with codable children: Secondary | ICD-10-CM | POA: Insufficient documentation

## 2011-11-29 DIAGNOSIS — D509 Iron deficiency anemia, unspecified: Secondary | ICD-10-CM | POA: Insufficient documentation

## 2011-11-29 DIAGNOSIS — Z30431 Encounter for routine checking of intrauterine contraceptive device: Secondary | ICD-10-CM | POA: Insufficient documentation

## 2011-11-29 DIAGNOSIS — N949 Unspecified condition associated with female genital organs and menstrual cycle: Secondary | ICD-10-CM | POA: Insufficient documentation

## 2011-11-30 ENCOUNTER — Telehealth: Payer: Self-pay | Admitting: *Deleted

## 2011-11-30 NOTE — Telephone Encounter (Signed)
Message copied by Farrell Ours on Thu Nov 30, 2011 10:44 AM ------      Message from: Durwin Reges      Created: Thu Nov 30, 2011 10:20 AM      Regarding: normal ultrasound       Please inform patient Stephanie Stanton was normal. IUD is in the right place.

## 2011-11-30 NOTE — Telephone Encounter (Signed)
Attempted to call patient, VM is not set up

## 2011-11-30 NOTE — Telephone Encounter (Signed)
Message copied by Farrell Ours on Thu Nov 30, 2011 10:42 AM ------      Message from: Durwin Reges      Created: Thu Nov 30, 2011 10:20 AM      Regarding: normal ultrasound       Please inform patient Stephanie Stanton was normal. IUD is in the right place.

## 2011-12-04 NOTE — Telephone Encounter (Signed)
Attempted to call again, closing encounter due to numerous attempts to reach patient.

## 2011-12-04 NOTE — Telephone Encounter (Signed)
Attempted to call patient again. Same message VM not set up

## 2012-02-02 ENCOUNTER — Ambulatory Visit (INDEPENDENT_AMBULATORY_CARE_PROVIDER_SITE_OTHER): Payer: Self-pay | Admitting: Family Medicine

## 2012-02-02 ENCOUNTER — Encounter: Payer: Self-pay | Admitting: Family Medicine

## 2012-02-02 VITALS — BP 100/64 | HR 53 | Temp 97.5°F | Ht 63.0 in | Wt 140.0 lb

## 2012-02-02 DIAGNOSIS — J309 Allergic rhinitis, unspecified: Secondary | ICD-10-CM

## 2012-02-02 DIAGNOSIS — M26609 Unspecified temporomandibular joint disorder, unspecified side: Secondary | ICD-10-CM

## 2012-02-02 DIAGNOSIS — S0300XA Dislocation of jaw, unspecified side, initial encounter: Secondary | ICD-10-CM

## 2012-02-02 MED ORDER — FLUTICASONE PROPIONATE 50 MCG/ACT NA SUSP: 2.0000 | Freq: Every day | NASAL | Status: DC

## 2012-02-02 MED ORDER — CETIRIZINE HCL 10 MG PO TABS: 10.0000 mg | ORAL_TABLET | Freq: Every day | ORAL | Status: AC

## 2012-02-02 NOTE — Progress Notes (Signed)
  Subjective:    Patient ID: Stephanie Stanton, female    DOB: 1975-10-19, 36 y.o.   MRN: [id number]  HPI  1. Allergies. Patient having runny nose and congestion since she started a new job. Making it difficult to sleep with snoring and breathing through nose is hard. Is baking with bisquick now, instead of normal flour and this has exacerbated her symptoms, she believes she is allergic to it. Not taking any medications. She is planning to find a new job. Denies sinus pain, fever, chills, headache., dyspnea.  2. Jaw pain. Worsening recently. Has a clicking and misalignment of right jaw, pain with chewing every time. She states she wakes up at night and her jaw is out of place, she has to replace it before she can go back to sleep. She does not have any devices or treatments for this at home. Has a history of nasal surgery with deviated septum. Denies eye pain, headahces, fever.  Review of Systems See HPI otherwise negative.  reports that she has never smoked. She does not have any smokeless tobacco history on file.     Objective:   Physical Exam  Vitals reviewed. Constitutional: She is oriented to person, place, and time. She appears well-developed and well-nourished. No distress.  HENT:  Right Ear: External ear normal.  Left Ear: External ear normal.  Mouth/Throat: Oropharynx is clear and moist.       Mild nasal congestion.  Right TMJ clicks and seems to sublux with full opening of jaw, self reduces.   No sinus or temporal TTP, no erythema or edema. No LAD.  Nasal mucosa slightly pale. Clear discharge.  Eyes: EOM are normal. Pupils are equal, round, and reactive to light.  Neck: Normal range of motion. Neck supple.  Pulmonary/Chest: Effort normal.  Neurological: She is alert and oriented to person, place, and time. Coordination normal.  Skin: No rash noted. She is not diaphoretic.  Psychiatric: She has a normal mood and affect.          Assessment & Plan:

## 2012-02-02 NOTE — Assessment & Plan Note (Signed)
Worsened. Discussed behavioral changes, avoiding gum, relaxation. Advised to obtain bite guard which might help at night time. Will attempt oral surgery referral though patient is uninsured, as it seems to be very painful and likely is subluxing/dislocating though has not been stuck yet.

## 2012-02-02 NOTE — Patient Instructions (Addendum)
Sounds like avoidance of trigger is best. You can use zyrtec and flonase spray for symptoms now. You can buy a bite guard at the drug store to try and keep jaw in place. Avoid chewing gum. Will make referral for oral surgery. Make appointment for follow up if symptoms worsen.  Disfuncin de la articulacin temporomandibular (Temporomandibular Problems) Una disfuncin de la articulacin temporomandibular implica que existen problemas en la articulacin que se encuentra entre la [place] y el crneo. Esta articulacin est delimitada por un cartlago, como otras articulaciones del cuerpo, pero tambin tiene un pequeo disco que impide que los huesos se raspen uno con otro. Estas articulaciones son como cualquier otra y pueden inflamarse (irritarse) por artritis y Engineer, site. Cuando esta articulacin duele, tambin puede producir dolor de cabeza, Engineer, mining en la mandbula y tambin en el rostro. CAUSAS  Generalmente los problemas de tipo artrtico son producidos por la inflamacin de Nurse, learning disability. La inflamacin tambin puede ser causada por el [place]. Puede haberse ocasionado al [employer] (bruxismo). Tambin puede producirse por la mala alineacin de Nurse, learning disability. DIAGNSTICO Generalmente el diagnstico (determinar cul es el problema) se realiza a travs de los antecedentes y el examen fsico. El profesional le solicitar radiografas o una imagen por resonancia magntica (IRM) para determinar la causa exacta. Podr ser necesario que visite al dentista para determinar si los dientes y la mandbula estn bien alineados. TRATAMIENTO En la mayora de las veces no se trata de un problema grave. En algunos casos puede persistir (hacerse crnico). Cuando esto ocurre, los [employer] reducen la inflamacin (irritacin) pueden ser de [place]. Tambin podr aliviarlo una inyeccin de corticoides. Si los dientes no estn alineados, el dentista lo solucionar con ortodoncia. Si no  se encuentran causas fsicas, el origen puede estar en la tensin. Si sta es la causa, le sern de utilidad las tcnicas de biorretroalimentacin y Microbiologist. INSTRUCCIONES PARA EL CUIDADO DOMICILIARIO  Puede aliviarlo la aplicacin de una bolsa con hielo. Coloque el hielo en una bolsa plstica y envulvala en una toalla para prevenir el congelamiento de la piel. Podr aplicarlo cada 2 horas durante 20  30 minutos, o cuando lo crea necesario, mientras se encuentre despierto, o segn se lo haya indicado el profesional que lo asiste.   Utilice los medicamentos de venta libre o de prescripcin para Chief Technology Officer, Environmental health practitioner o la [place], segn se lo indique el profesional que lo asiste.   Si le indican fisioterapia, siga las indicaciones de su mdico.   Si le indicaron aparatos de ortodoncia, selos segn las indicaciones.  Document Released: 02/15/2005 Document Revised: 04/27/2011 [facility] Patient Information 2012 [place], [place].

## 2012-02-02 NOTE — Assessment & Plan Note (Signed)
Worsened, perhaps secondary to new exposure at work. No sign of infectious etiology (fever, sinus pain, headache). Discussed avoidance of triggers (she thinks is bisquick). Trial of zyrtec and flonase spray for symptom relief. F/u prn.

## 2012-02-19 ENCOUNTER — Telehealth: Payer: Self-pay | Admitting: Family Medicine

## 2012-02-19 NOTE — Telephone Encounter (Signed)
Attempted to call pt to tell her that there hasn't been anyone available to take her as a pt for this type of referral. Phone rang and rang. No VM. Should this pt call back please inform her of the following information:.Stephanie Stanton, Stephanie Stanton    This is the message that Lupita Leash L. Sent to Dr.Konkol concerning the referral;   I am having no luck with the oral surgeon referral. No one at [facility] will return my call, I have left 3 messages and [facility] does not have any oral surgeon on staff that deals with TMJ. How would you like me to proceed??? Lupita Leash

## 2012-02-19 NOTE — Telephone Encounter (Signed)
Patient is calling to check the status of the referral from 2 weeks ago.

## 2012-02-20 ENCOUNTER — Encounter: Payer: Self-pay | Admitting: Family Medicine

## 2012-02-20 NOTE — Telephone Encounter (Signed)
Spoke with pt and told her that there were no Oral Surgeons available @ [facility] or @ [facility]. Pt was told to try to find someone on her own since she will have to pay out of pocket and maybe she can get some one to work with her budget. Pt voiced understanding and agreed.Stephanie Stanton [place]

## 2012-02-21 ENCOUNTER — Telehealth: Payer: Self-pay | Admitting: Family Medicine

## 2012-02-21 NOTE — Telephone Encounter (Signed)
Pt will come to pick up Dr's letter for her work and pt is aware about surgeon doctor can't take her with [facility]. Pt will call [facility] to try to see a Dr over there and apply for financial aid.  [employer]ines

## 2012-03-15 ENCOUNTER — Telehealth: Payer: Self-pay | Admitting: Family Medicine

## 2012-03-15 NOTE — Telephone Encounter (Signed)
I called and spoke to someone at [facility] and they do not see patient's for TMJ.  States the only thing they would recommend would be a night guard.  They did state that the [facility] sometimes sees patients for TMJ.  Their phone number is [phone].  I ask [employer] to call patient to let her no [facility] or [facility] oral surgeons are unavailable.  She can scheduled an appointment to come back and discuss this with Dr. Cristal Ford.  Appointment scheduled for 03/27/2012 per [employer].  Ileana Ladd

## 2012-03-15 NOTE — Telephone Encounter (Signed)
Ms. Stephanie Stanton calling to ask that provider send referral to [facility] Oral Surgeon to have her be seen because they don't know exactly why she need to have this procedure.

## 2012-03-27 ENCOUNTER — Encounter: Payer: Self-pay | Admitting: Family Medicine

## 2012-03-27 ENCOUNTER — Ambulatory Visit (INDEPENDENT_AMBULATORY_CARE_PROVIDER_SITE_OTHER): Payer: Self-pay | Admitting: Family Medicine

## 2012-03-27 VITALS — BP 100/50 | HR 69 | Temp 98.0°F | Ht 63.0 in | Wt 138.3 lb

## 2012-03-27 DIAGNOSIS — M26609 Unspecified temporomandibular joint disorder, unspecified side: Secondary | ICD-10-CM

## 2012-03-27 DIAGNOSIS — D509 Iron deficiency anemia, unspecified: Secondary | ICD-10-CM

## 2012-03-27 NOTE — Progress Notes (Signed)
Interpreter Burton Gahan Namihira for Dr Konkol 

## 2012-03-27 NOTE — Patient Instructions (Addendum)
I am sorry you still have jaw pain. Try to avoid chewing for next 2 weeks. You can try taking motrin 600 mg 2-3 times per day. Find a bite guard to use at night time. There is not a good surgery option usually. Make appointment if your symptoms worsen.  Disfuncin de la articulacin temporomandibular (Temporomandibular Problems) Una disfuncin de la articulacin temporomandibular implica que existen problemas en la articulacin que se encuentra entre la [place] y el crneo. Esta articulacin est delimitada por un cartlago, como otras articulaciones del cuerpo, pero tambin tiene un pequeo disco que impide que los huesos se raspen uno con otro. Estas articulaciones son como cualquier otra y pueden inflamarse (irritarse) por artritis y Engineer, site. Cuando esta articulacin duele, tambin puede producir dolor de cabeza, Engineer, mining en la mandbula y tambin en el rostro. CAUSAS  Generalmente los problemas de tipo artrtico son producidos por la inflamacin de Nurse, learning disability. La inflamacin tambin puede ser causada por el [place]. Puede haberse ocasionado al [employer] (bruxismo). Tambin puede producirse por la mala alineacin de Nurse, learning disability. DIAGNSTICO Generalmente el diagnstico (determinar cul es el problema) se realiza a travs de los antecedentes y el examen fsico. El profesional le solicitar radiografas o una imagen por resonancia magntica (IRM) para determinar la causa exacta. Podr ser necesario que visite al dentista para determinar si los dientes y la mandbula estn bien alineados. TRATAMIENTO En la mayora de las veces no se trata de un problema grave. En algunos casos puede persistir (hacerse crnico). Cuando esto ocurre, los [employer] reducen la inflamacin (irritacin) pueden ser de [place]. Tambin podr aliviarlo una inyeccin de corticoides. Si los dientes no estn alineados, el dentista lo solucionar con ortodoncia. Si no se encuentran causas  fsicas, el origen puede estar en la tensin. Si sta es la causa, le sern de utilidad las tcnicas de biorretroalimentacin y Microbiologist. INSTRUCCIONES PARA EL CUIDADO DOMICILIARIO  Puede aliviarlo la aplicacin de una bolsa con hielo. Coloque el hielo en una bolsa plstica y envulvala en una toalla para prevenir el congelamiento de la piel. Podr aplicarlo cada 2 horas durante 20  30 minutos, o cuando lo crea necesario, mientras se encuentre despierto, o segn se lo haya indicado el profesional que lo asiste.  Utilice los medicamentos de venta libre o de prescripcin para Chief Technology Officer, Environmental health practitioner o la [place], segn se lo indique el profesional que lo asiste.  Si le indican fisioterapia, siga las indicaciones de su mdico.  Si le indicaron aparatos de ortodoncia, selos segn las indicaciones. Document Released: 02/15/2005 Document Revised: 07/31/2011 [facility] Patient Information 2013 [place], [place].

## 2012-03-28 NOTE — Assessment & Plan Note (Signed)
Patient requested stool cards to complete since she didn't do this after last visit. We are evaluating anemia that improved with iron replacement earlier this year.

## 2012-03-28 NOTE — Progress Notes (Signed)
  Subjective:    Patient ID: Stephanie Stanton, female    DOB: 1975/12/23, 36 y.o.   MRN: [id number]  HPI  1. F/u TMJ. Patient still feeling a click in her right jaw with chewing. She only feels pain at night when sleeping on that side. She has started sleeping on her back, and has some improvement. She has been trying to find a Careers adviser to fix the problem. We referred her to oral surgery at [facility] and they do not perform any surgeries, she was told to get a bite guard. This costs several hundred dollars at the dentist.   Denies injury, falls, eye pain, ear drainage, tooth pain, swelling, fever, chills, difficulty swallowing, headaches, vision problems.   Review of Systems See HPI otherwise negative.  reports that she has never smoked. She does not have any smokeless tobacco history on file.     Objective:   Physical Exam  Vitals reviewed. Constitutional: She appears well-developed and well-nourished. No distress.  HENT:  Head: Normocephalic and atraumatic.  Nose: Nose normal.  Mouth/Throat: Oropharynx is clear and moist. No oropharyngeal exudate.       Palpable click at right mandibular angle with opening.  No edema. No TTP. No erythema. No LAD. Neck is supple.  Eyes: EOM are normal. Pupils are equal, round, and reactive to light.  Neck: Neck supple.  Cardiovascular: Normal rate, regular rhythm and normal heart sounds.   Pulmonary/Chest: Effort normal.  Skin: She is not diaphoretic.  Psychiatric: She has a normal mood and affect.       Assessment & Plan:

## 2012-03-28 NOTE — Assessment & Plan Note (Signed)
Discussed behavioral changes. Advised to try and avoid chewing, especially tough foods. Stick with soft items and liquids, avoid gum. Find an OTC bite guard to wear at night. There will probably not be a surgical solution to this problem, discussed with patient. F/u if not improving or if other symptoms develop.

## 2012-10-09 ENCOUNTER — Encounter: Payer: Self-pay | Admitting: Family Medicine

## 2012-10-09 ENCOUNTER — Ambulatory Visit (INDEPENDENT_AMBULATORY_CARE_PROVIDER_SITE_OTHER): Payer: Self-pay | Admitting: Family Medicine

## 2012-10-09 VITALS — BP 92/52 | HR 79 | Temp 99.2°F | Ht 63.0 in | Wt 135.0 lb

## 2012-10-09 DIAGNOSIS — M25561 Pain in right knee: Secondary | ICD-10-CM

## 2012-10-09 DIAGNOSIS — J069 Acute upper respiratory infection, unspecified: Secondary | ICD-10-CM

## 2012-10-09 DIAGNOSIS — M25569 Pain in unspecified knee: Secondary | ICD-10-CM

## 2012-10-09 NOTE — Patient Instructions (Addendum)
Get your knee xray done.  Do your strength exercises  You can try motrin (ibuprofen -600 mg three times daily).  1. straight leg raises-hold for 10 seconds, 3 sets of 10 2. Side leg raises, hold for 10 seconds. If this exercise doesn't help, please come back in 6 weeks for check up.  If your cold symptoms or sore throat do not improve, then call doctor.   Patella Problems (Patellofemoral Syndrome) This syndrome is caused by changes in the undersurface of the kneecap (patella). The changes vary from minor inflammation to major changes such as breakdown of the cartilage on the undersurface of the patella. The major changes can be seen with an arthroscope (a small, pencil-sized telescope). These changes can result from various factors. These factors may arise from abnormal tracking (movement or malalignment) of the patella. Normally the Patella is in its normal groove located between the condyles (grooved end) of the femur (thigh bone). Abnormal movement leads to increased pressure in the patellofemoral joint. This leads to swelling in the cartilage, inflammation and pain. SYMPTOMS  The patient with this syndrome usually has an ache in the knee. It is often aggravated by:  Prolonged sitting.  Squatting.  Climbing stairs.  Running down hill.  Other exercising that stresses the knee. Other findings may include the knee giving way, swelling, and or locking. TREATMENT  The treatment will depend on the cause of the problem. Sometimes the solution is as simple as cutting down on activities. Giving your joint a rest with the use of crutches and braces can also help. This is generally followed by strengthening exercises. RECOVERY Recovery from a patellar problem depends on the type of problem in your knee and on the treatment required. If conservative treatment works the recovery period may be as little as three to four weeks. If more aggressive therapy such as surgery is required, the recovery  period may be several months. Your caregiver will discuss this with you. HOME CARE INSTRUCTIONS  Following exercise, use an ice pack for twenty to thirty minutes three to four times per day. Use a towel between your ice pack and the skin.  Reduction of inflammation with anti-inflammatories may be helpful. Only take over-the-counter or prescription medicines for pain, discomfort, or fever as directed by your caregiver.  Taping the knee or using a neoprene sleeve with a patellar cutout to provide better tracking of the patella may give relief.  Muscle (quadriceps) strengthening exercises are helpful. Follow your caregiver's advice.  Muscle stretching prior to exercise may be helpful.  Soft tissue therapy using ultrasound, and diathermy may be helpful.  If conservative therapy is not effective, surgery may provide relief. During arthroscopy, your caregiver may discover a rough surface beneath your kneecap. If this happens, your caregiver may smooth this out by shaving the surface. SEEK MEDICAL CARE IF: If you have surgery, see your caregiver if:  There is increased bleeding or clear fluid (more than a small spot) from the wound.  You notice redness, swelling, or increasing pain in the wound.  Pus is coming from wound.  You develop an unexplained oral temperature above 102 F (38.9 C) develops, or as your caregiver suggests.  You notice a foul smell coming from the wound or dressing.  You develop increasing pain or stiffness in your knee. SEEK IMMEDIATE MEDICAL CARE IF:   You develop a rash.  You have difficulty breathing.  You have any allergic problems. MAKE SURE YOU:   Understand these instructions.  Will watch  your condition.  Will get help right away if you are not doing well or get worse. Document Released: 05/05/2000 Document Revised: 07/31/2011 Document Reviewed: 05/25/2008 [facility] Patient Information 2014 [place], [place].

## 2012-10-10 DIAGNOSIS — J069 Acute upper respiratory infection, unspecified: Secondary | ICD-10-CM | POA: Insufficient documentation

## 2012-10-10 DIAGNOSIS — M25561 Pain in right knee: Secondary | ICD-10-CM | POA: Insufficient documentation

## 2012-10-10 NOTE — Assessment & Plan Note (Signed)
Lateral pain for 2 months afer increasing her physcical activity. No effusion or acute injury. 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 Less likely bone pathology, but with night pain will check plain film. Recommend strengthening quad and hip abduction muscles. NSAID prn. F/u in 4-6 weeks if not improved.

## 2012-10-10 NOTE — Assessment & Plan Note (Signed)
Cold type symptoms x 1 day. Provided reassurance and supportive care recommended. F/u if not improving in next week.

## 2012-10-10 NOTE — Progress Notes (Signed)
  Subjective:    Patient ID: Stephanie Stanton, female    DOB: 10-13-1975, 37 y.o.   MRN: [id number]  Sore Throat   Knee Pain     1. Right knee pain. Began 2 months ago. She has been doing a video workout for exercise in attempt to get into shape and lose weight. She notices pain on the lateral side of knee that radiated down to her shin sometimes. Worse with walking, lying down and feels it sharply at night. Endorses mild intermittent swelling. No redness or warmth. Denies any acute events, falls, or injuries. Not using any medications or therapies.   She has lost 10 lbs, but this is intentional. No night sweats, fevers, chills, numbness, tingling, foot weakness, falls.  2. Cough/runny nose. Began yesterday, her husband has similar symptoms. No fever, dyspnea, wheezing, facial pain.   Review of Systems See HPI otherwise negative.  reports that she has never smoked. She does not have any smokeless tobacco history on file.     Objective:   Physical Exam  Vitals reviewed. Constitutional: She appears well-developed and well-nourished. No distress.  HENT:  Head: Normocephalic and atraumatic.  Right Ear: External ear normal.  Left Ear: External ear normal.  Mouth/Throat: No oropharyngeal exudate.  Mild pharyngeal erythema. No exudate. +rhinorrhea. No sinus TTP.  Eyes: Conjunctivae and EOM are normal.  Neck: Neck supple. No thyromegaly present.  Pulmonary/Chest: Effort normal and breath sounds normal.  Musculoskeletal:  Right knee no effusion, warmth or erythema. ROM is intact and symmetric to left. Pain with patellar compression, excess patellar laxity compared to left. Slight TTP at lateral right joint line. No tibial TTP. Equivocal mcmurray tests.  Right hip abductor strength 4+/5 compared to left 5/5. Ankle dorsiflexion and hip flexion B 5/5.  Lymphadenopathy:    She has no cervical adenopathy.  Skin: She is not diaphoretic.        Assessment & Plan:

## 2012-10-15 ENCOUNTER — Ambulatory Visit (HOSPITAL_COMMUNITY)
Admission: RE | Admit: 2012-10-15 | Discharge: 2012-10-15 | Disposition: A | Discharge status: Home / Self Care | Payer: Self-pay | Source: Ambulatory Visit | Attending: Family Medicine | Admitting: Family Medicine

## 2012-10-15 DIAGNOSIS — M25569 Pain in unspecified knee: Secondary | ICD-10-CM | POA: Insufficient documentation

## 2012-10-15 DIAGNOSIS — M25561 Pain in right knee: Secondary | ICD-10-CM

## 2012-10-16 ENCOUNTER — Telehealth: Payer: Self-pay | Admitting: Family Medicine

## 2012-10-16 NOTE — Telephone Encounter (Signed)
Please notify her xrays were normal. Keep doing the exercises and f/u if not improved in 2 months.

## 2012-10-16 NOTE — Telephone Encounter (Signed)
Pt notified.  Stephanie Stanton, CMA  

## 2012-11-01 ENCOUNTER — Other Ambulatory Visit: Payer: Self-pay | Admitting: Family Medicine

## 2012-11-01 ENCOUNTER — Telehealth: Payer: Self-pay | Admitting: Family Medicine

## 2012-11-01 DIAGNOSIS — K0889 Other specified disorders of teeth and supporting structures: Secondary | ICD-10-CM

## 2012-11-01 NOTE — Telephone Encounter (Signed)
Pt needs a routine cleaning.  Told patient that it can take several months to get an appt.  Pt verbalized understanding.  Will fwd to MD to put in the referral.  Form filled out and placed on Donna's desk.   Aleisha Paone, Darlyne Russian, CMA

## 2012-11-01 NOTE — Telephone Encounter (Signed)
Referral ordered

## 2012-11-01 NOTE — Telephone Encounter (Signed)
Pt needs a referral to the dentist

## 2012-12-10 ENCOUNTER — Ambulatory Visit: Payer: No Typology Code available for payment source | Admitting: Family Medicine

## 2012-12-17 ENCOUNTER — Ambulatory Visit: Payer: No Typology Code available for payment source | Admitting: Family Medicine

## 2013-03-06 ENCOUNTER — Ambulatory Visit: Payer: No Typology Code available for payment source | Admitting: Family Medicine

## 2013-06-16 ENCOUNTER — Ambulatory Visit: Payer: Self-pay

## 2013-07-22 ENCOUNTER — Encounter: Payer: No Typology Code available for payment source | Admitting: Family Medicine

## 2013-07-29 ENCOUNTER — Encounter: Payer: Self-pay | Admitting: Family Medicine

## 2013-07-29 ENCOUNTER — Ambulatory Visit (INDEPENDENT_AMBULATORY_CARE_PROVIDER_SITE_OTHER): Payer: No Typology Code available for payment source | Admitting: Family Medicine

## 2013-07-29 VITALS — BP 100/48 | HR 77 | Temp 98.9°F | Ht 63.5 in | Wt 136.0 lb

## 2013-07-29 DIAGNOSIS — F32A Depression, unspecified: Secondary | ICD-10-CM

## 2013-07-29 DIAGNOSIS — F3289 Other specified depressive episodes: Secondary | ICD-10-CM

## 2013-07-29 DIAGNOSIS — F329 Major depressive disorder, single episode, unspecified: Secondary | ICD-10-CM | POA: Insufficient documentation

## 2013-07-29 DIAGNOSIS — E781 Pure hyperglyceridemia: Secondary | ICD-10-CM

## 2013-07-29 DIAGNOSIS — D509 Iron deficiency anemia, unspecified: Secondary | ICD-10-CM

## 2013-07-29 DIAGNOSIS — Z0000 Encounter for general adult medical examination without abnormal findings: Secondary | ICD-10-CM | POA: Insufficient documentation

## 2013-07-29 LAB — CBC
HEMATOCRIT: 29.3 % — AB (ref 36.0–46.0)
HEMOGLOBIN: 9.2 g/dL — AB (ref 12.0–15.0)
MCH: 22.3 pg — ABNORMAL LOW (ref 26.0–34.0)
MCHC: 31.4 g/dL (ref 30.0–36.0)
MCV: 71.1 fL — AB (ref 78.0–100.0)
Platelets: 473 10*3/uL — ABNORMAL HIGH (ref 150–400)
RBC: 4.12 MIL/uL (ref 3.87–5.11)
RDW: 17.5 % — AB (ref 11.5–15.5)
WBC: 6 10*3/uL (ref 4.0–10.5)

## 2013-07-29 MED ORDER — FERROUS SULFATE 325 (65 FE) MG PO TABS: 325.0000 mg | ORAL_TABLET | Freq: Three times a day (TID) | ORAL | Status: DC

## 2013-07-29 NOTE — Patient Instructions (Signed)
Fue agradable verte hoy.  Estoy comprobando sus cuentas de sangre hoy. Por favor, contine tomando el hierro (que he enviado en una receta para esto).  En cuanto a su depresin, por favor pngase en contacto con la [facility]. [facility] [address] [place] [id number] [place] [phone]; Fax [phone]  Siga en ~ 1 mes para re-evaluacin / seguimiento con respecto a su anemia y su DIU.

## 2013-07-29 NOTE — Assessment & Plan Note (Signed)
Checking CBC today. Iron refilled today.

## 2013-07-29 NOTE — Progress Notes (Signed)
   Subjective:    Patient ID: Stephanie Stanton, female    DOB: 06/16/1975, 38 y.o.   MRN: [id number]  HPI 38 year old female with history of iron deficiency anemia who presents for an annual PE. Current issues/complaints:  1) Depression Report of symptoms (SIGECAPS): Has had the following symptoms/issues for the past 2 years.   This has worsened since her children have left the house; this has also worsened with upcoming divorce.  Sleep - Increased; wants to stay in the bed most of the day. Interest - Not interested in her normal activities/interests (reading, going out) Guilt - No Energy - No energy (never has energy) Concentration - Reports difficulty with concentration Appetite - Mildly decreased Suicidal ideation - Denies SI but reports that she does feel like she "would be better off not living".  States that she has never thought of harming herself.   Duration of symptoms: ~ 2 years.  Impact on function:  Significantly impact her function  Psychiatric History (include age of onset of first mood disturbance): None  Family history of psychiatric issues: Yes - Uncles, Brother  Current and history of substance use:  No  Medical conditions that might explain or contribute to symptoms:  Anemia may be contributing to fatigue.  PHQ-9:10  2) Iron Deficiency anemia - Continues to have significant fatigue - Not currently taking Iron (ran out 2 month prior). - Reports slightly heavy menstrual period (increased after placement of copper IUD).  Review of Systems Per HPI    Objective:   Physical Exam Filed Vitals:   07/29/13 1510  BP: 100/48  Pulse: 77  Temp: 98.9 F (37.2 C)   Exam: General: well appearing female, NAD.  HEENT: NCAT. Normal TM's.  Cardiovascular: RRR. No murmurs, rubs, or gallops. Respiratory: CTAB. No rales, rhonchi, or wheeze. Abdomen: soft, nontender, nondistended. Extremities: warm, well perfused. No LE edema. Skin: Warm, dry, intact. Psych:  flat affect noted.     Assessment & Plan:  See Problem List  A total of 25 minutes were spent face-to-face with the patient during this encounter and over half of that time was spent on counseling and coordination of care.

## 2013-07-29 NOTE — Assessment & Plan Note (Addendum)
PHQ-9 = 10. Patient history suggestive of depression; this appears to be worsened by stressors. Discussed treatment options with patient.  Patient declined pharmacotherapy.  Patient amendable to psychotherapy.  [facility].

## 2013-07-29 NOTE — Assessment & Plan Note (Signed)
Patient declined pelvic exam and breast exam today (she desires to see a female provider.) No pap smear needed today. Patient to establish with female provider.

## 2013-07-30 ENCOUNTER — Ambulatory Visit (INDEPENDENT_AMBULATORY_CARE_PROVIDER_SITE_OTHER): Payer: No Typology Code available for payment source | Admitting: Family Medicine

## 2013-07-30 ENCOUNTER — Encounter: Payer: Self-pay | Admitting: Family Medicine

## 2013-07-30 DIAGNOSIS — M545 Low back pain, unspecified: Secondary | ICD-10-CM

## 2013-07-30 MED ORDER — CYCLOBENZAPRINE HCL 10 MG PO TABS: 10.0000 mg | ORAL_TABLET | Freq: Every day | ORAL | Status: DC

## 2013-07-30 MED ORDER — TRAMADOL HCL 50 MG PO TABS: 50.0000 mg | ORAL_TABLET | Freq: Three times a day (TID) | ORAL | Status: DC | PRN
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
## 2013-08-28 ENCOUNTER — Ambulatory Visit: Payer: No Typology Code available for payment source | Admitting: Family Medicine

## 2013-09-16 ENCOUNTER — Ambulatory Visit: Payer: No Typology Code available for payment source | Admitting: Family Medicine

## 2013-10-03 ENCOUNTER — Ambulatory Visit (INDEPENDENT_AMBULATORY_CARE_PROVIDER_SITE_OTHER): Payer: No Typology Code available for payment source | Admitting: Family Medicine

## 2013-10-03 VITALS — BP 107/62 | HR 69 | Temp 98.1°F | Wt 132.4 lb

## 2013-10-03 DIAGNOSIS — Z309 Encounter for contraceptive management, unspecified: Secondary | ICD-10-CM

## 2013-10-03 DIAGNOSIS — D509 Iron deficiency anemia, unspecified: Secondary | ICD-10-CM

## 2013-10-03 DIAGNOSIS — Z30432 Encounter for removal of intrauterine contraceptive device: Secondary | ICD-10-CM

## 2013-10-03 DIAGNOSIS — N760 Acute vaginitis: Secondary | ICD-10-CM

## 2013-10-03 LAB — POCT WET PREP (WET MOUNT): CLUE CELLS WET PREP WHIFF POC: NEGATIVE

## 2013-10-03 LAB — POCT URINE PREGNANCY: Preg Test, Ur: NEGATIVE

## 2013-10-03 MED ORDER — NORGESTIMATE-ETH ESTRADIOL 0.25-35 MG-MCG PO TABS: 1.0000 | ORAL_TABLET | Freq: Every day | ORAL | Status: DC

## 2013-10-03 NOTE — Progress Notes (Signed)
   Subjective:    Patient ID: Stephanie Stanton, female    DOB: 08/16/1975, 38 y.o.   MRN: [id number]  HPI Stephanie Stanton is a 38 y.o. spanish speaking female present today for IUD removal due to increased bleeding and anemia. Admission interpreter present throughout appointment.  Anemia: Patient has been diagnosed with iron deficiency anemia. She is currently taking iron supplementation daily. She endorses fatigue. She denies dizziness, chest pain, palpitations, shortness of breath or syncopal episodes. Patient has been anemic in review of her records dating back for 4 years.  IUD: Patient states that she had her IUD placed in 2012. She states that she's had IUDs multiple times throughout her life span in usually does well with them. However she has recently diagnosed with anemia. She believes this is due to increased bleeding from her IUD. She states her menstrual cycles are much greater than they used to be and are irregular. She currently is asymptomatic other than fatigue,  from her anemia. She is desiring to have her IUD removed today.   Review of Systems Negative, with the exception of above mentioned in HPI     Objective:   Physical Exam BP 107/62  Pulse 69  Temp(Src) 98.1 F (36.7 C) (Oral)  Wt 132 lb 6.4 oz (60.056 kg) Gen: No acute distress, nontoxic in appearance. Spanish speaking female. CV: RRR  Chest: CTAB, no wheeze or crackles Abd: Soft. flat. NTND. BS present. No Masses palpated.    GYN:  External genitalia within normal limits.  Vaginal mucosa pink, moist, normal rugae.  Nonfriable cervix without lesions, no discharge or bleeding noted on speculum exam.  IUD in cervix. Bimanual exam revealed normal, nongravid uterus.  No cervical motion tenderness. No adnexal masses bilaterally.     IUD Removal Procedure Note  Diagnosis: Irregular bleeding, anemia with IUD in place  Post-operative Diagnosis: same, with removal of IUD  Indications: anemia and patient  desire to have IUD removed  Procedure Details   The risks and benefits of the procedure were explained to the patient and Verbal informed consent was obtained.    Cervix cleansed with Betadine. IUD strings identified easily. Grasped IUD strings with Sponge forceps and IUD was removed easily from cervix without resistance. Mild bleeding noted with removal from cervical OS.  Patient tolerated procedure well. Complications: None  Plan The patient was advised to call for any fever or for prolonged or severe pain or bleeding. She was advised to use NSAID as needed for mild to moderate pain.

## 2013-10-03 NOTE — Patient Instructions (Signed)
Elección del método anticonceptivo  (Contraception Choices)  La anticoncepción (control de la natalidad) es el uso de cualquier método o dispositivo para evitar el embarazo. A continuación se indican algunos de esos métodos.  MÉTODOS HORMONALES   · El Implante contraconceptivo consiste en un tubo plástico delgado que contiene la hormona progesterona. No contiene estrógenos. El médico inserta el tubo en la parte interna del brazo. El tubo puede permanecer en el lugar durante 3 años. Después de los 3 años debe retirarse. El implante impide que los ovarios liberen óvulos (ovulación), espesa el moco cervical, lo que evita que los espermatozoides ingresen al útero y hace más delgada la membrana que cubre el interior del útero.  · Inyecciones de progesterona sola: las administra el médico cada 3 meses para evitar el embarazo. La progesterona sintética impide que los ovarios liberen óvulos. También hacen que el moco cervical se espese y modifique el tejido de recubrimiento interno del útero. Esto hace más difícil que los espermatozoides sobrevivan en el útero.  · Las píldoras anticonceptivas contienen estrógenos y progesterona. Su función es evitar que los ovarios liberen óvulos (ovulación). Las hormonas de los anticonceptivos orales hacen que el moco cervical se haga más espeso, lo que evita que el esperma ingrese al útero. Las píldoras anticonceptivas son recetadas por el médico. También se utilizan para tratar los períodos menstruales abundantes.  · Minipíldora: este tipo de píldora anticonceptiva contiene sólo hormona progesterona. Deben tomarse todos los días del mes y debe recetarlas el médico.  · El parche de control de natalidad: contiene hormonas similares a las que contienen las píldoras anticonceptivas. Deben cambiarse una vez por semana y se utilizan bajo prescripción médica.  · Anillo vaginal: contiene hormonas similares a las que contienen las píldoras anticonceptivas. Se deja colocado durante tres semanas,  se lo retira durante 1 semana y luego se coloca uno nuevo. La paciente debe sentirse cómoda al insertar y retirar el anillo de la vagina. Es necesaria la prescripción médica.  · Anticonceptivos de emergencia: son métodos para evitar un embarazo después de una relación sexual sin protección. Esta píldora puede tomarse inmediatamente después de tener relaciones sexuales o hasta 5 días de haber tenido sexo sin protección. Es más efectiva si se toma poco tiempo después de la relación sexual. Los anticonceptivos de emergencia están disponibles sin prescripción médica. Consúltelo con su farmacéutico. No use los anticonceptivos de emergencia como único método anticonceptivo.  MÉTODOS DE BARRERA   · Condón masculino: es una vaina delgada (látex o goma) que se coloca cubriendo al pene durante el acto sexual. Puede usarse con espermicida para aumentar la efectividad.  · Condón femenino. Es una funda delicada y blanda que se adapta holgadamente a la vagina antes de las relaciones sexuales.  · Diafragma: es una barrera de látex redonda y suave que debe ser recomendado por un profesional. Se inserta en la vagina, junto con un gel espermicida. Debe insertarse antes de tener relaciones sexuales. Debe dejar el diafragma colocado en la vagina durante 6 a 8 horas después de la relación sexual.  · Capuchón cervical: es una barrera de látex o taza plástica redonda y suave que cubre el cuello del útero y debe ser colocada por un médico. Puede dejarlo colocado en la vagina hasta 48 horas después de las relaciones sexuales.  · Esponja: es una pieza blanda y circular de espuma de poliuretano. Contiene un espermicida. Se inserta en la vagina después de mojarla y antes de las relaciones sexuales.  · Espermicidas: son sustancias químicas que matan   o bloquean al esperma y no lo dejan ingresar al cuello del útero y al útero. Vienen en forma de cremas, geles, supositorios, espuma o comprimidos. No es necesario tener receta médica. Se insertan en  la vagina con un aplicador antes de tener relaciones sexuales. El proceso debe repetirse cada vez que tiene relaciones sexuales.  ANTICONCEPTIVOS INTRAUTERINOS  · Dispositivo intrauterino (DIU) es un dispositivo en forma de T que se coloca en el útero durante el período menstrual, para evitar el embarazo. Hay dos tipos:  · DIU de cobre: este tipo de DIU está recubierto con un alambre de cobre y se inserta dentro del útero. El cobre hace que el útero y las trompas de Falopio produzcan un liquido que destruye los espermatozoides. Puede permanecer colocado durante 10 años.  · DIU con hormona: este tipo de DIU contiene la hormona progestina (progesterona sintética). La hormona espesa el moco cervical y evita que los espermatozoides ingresen al útero y también afina la membrana que cubre el útero para evitar la implantación del óvulo fertilizado. La hormona debilita o destruye los espermatozoides que ingresan al útero. Puede permanecer en el lugar durante 3 5 años, según el tipo de DIU que se utilice.  MÉTODOS ANTICONCEPTIVOS PERMANENTES  · Ligadura de trompas en la mujer: se realiza sellando, atando u obstruyendo quirúrgicamente las trompas de Falopio lo que impide que el óvulo descienda hacia el útero.  · Esterilización histeroscópica: Implica la colocación de un pequeño espiral o la inserción en cada trompa de Falopio. El médico utiliza una técnica llamada histeroscopía para realizar este procedimiento. El dispositivo produce la formación de tejido cicatrizal. Esto da como resultado una obstrucción permanente de las trompas de Falopio, de modo que la esperma no pueda fertilizar el óvulo. Demora alrededor de 3 meses después del procedimiento hasta que el conducto se obstruye. Tendrá que usar otro método anticonceptivo durante al menos 3 meses.  · Esterilización masculina: se realiza ligando los conductos por los que pasan los espermatozoides (vasectomía).Esto impide que el esperma ingrese a la vagina durante el acto  sexual. Luego del procedimiento, el hombre puede eyacular líquido (semen).  MÉTODOS DE PLANIFICACIÓN NATURAL  · Planificación familiar natural: consiste en no tener relaciones sexuales o usar un método de barrera (condón, diafragma, capuchón cervical) en los días que la mujer podría quedar embarazada.  · Método de calendario: consiste en el seguimiento de la duración de cada ciclo menstrual y la identificación de los períodos fértiles.  · Método de ovulación: consiste en evitar las relaciones sexuales durante la ovulación.  · Método sintotérmico: consiste en evitar las relaciones sexuales en la época en la que se está ovulando, utilizando un termómetro y tendiendo en cuenta los síntomas de la ovulación.  · Método postovulación: consiste en planificar las relaciones sexuales para después de haber ovulado.  Independientemente del tipo o método anticonceptivo que usted elija, es importante que use condones para protegerse contra las infecciones de transmisión sexual (ETS). Hable con su médico con respecto a qué método anticonceptivo es el más apropiado para usted.  Document Released: 05/08/2005 Document Revised: 01/08/2013  [employer]® Patient Information ©2014 [employer].

## 2013-10-03 NOTE — Assessment & Plan Note (Signed)
Contraception counseling in detail today along with cost of each type of contraception for orange card holder. IUD removed Pregnancy test today If negative will prescribe Sprintec birth control pills.

## 2013-10-06 ENCOUNTER — Encounter: Payer: Self-pay | Admitting: Family Medicine

## 2013-10-06 DIAGNOSIS — Z30432 Encounter for removal of intrauterine contraceptive device: Secondary | ICD-10-CM | POA: Insufficient documentation

## 2013-10-06 NOTE — Assessment & Plan Note (Signed)
IUD was removed successfully in office today without complications, patient tolerated well.

## 2013-10-06 NOTE — Assessment & Plan Note (Signed)
Removal of IUD today. Advised patient to continue iron therapy 3 times a day. Patient to return to clinic in 4-6 weeks and which we will complete another anemia/iron panel.

## 2013-12-11 ENCOUNTER — Encounter: Payer: Self-pay | Admitting: Family Medicine

## 2013-12-11 ENCOUNTER — Ambulatory Visit (INDEPENDENT_AMBULATORY_CARE_PROVIDER_SITE_OTHER): Payer: No Typology Code available for payment source | Admitting: Family Medicine

## 2013-12-11 VITALS — BP 93/57 | HR 59 | Temp 98.7°F | Ht 63.0 in | Wt 139.0 lb

## 2013-12-11 DIAGNOSIS — M7630 Iliotibial band syndrome, unspecified leg: Secondary | ICD-10-CM | POA: Insufficient documentation

## 2013-12-11 DIAGNOSIS — M7631 Iliotibial band syndrome, right leg: Secondary | ICD-10-CM

## 2013-12-11 DIAGNOSIS — M2420 Disorder of ligament, unspecified site: Secondary | ICD-10-CM

## 2013-12-11 DIAGNOSIS — M629 Disorder of muscle, unspecified: Secondary | ICD-10-CM

## 2013-12-11 NOTE — Assessment & Plan Note (Signed)
Pain at insertion point as well as medial joint line of knee Suspect weakness of hip abductors playing a role as well as likely there is some pelvic tilt loading the medial aspect of the knee joint May additionally have piriformis issues as pointed to this region on testing one legged stance Given exercises for IT band strengthening and stretching, especially in light of the fact that pt felt improved when previously was more active Icing/heating as indicated to points of discomfort  Pt already had nml right knee xray which is reassuring Will f/up in 3-4 weeks Ideally would like to get in with physical therapy if home remedies not helpful (however unclear if orange card will cover)

## 2013-12-11 NOTE — Patient Instructions (Signed)
Instrucciones de Ejercicio: Side pierna Raise: Acustese sobre su lado derecho y Lexicographerlevantar la pierna izquierda a 45 grados de [place] manera controlada, a continuacin, baje de nuevo hacia abajo a la posicin inicial. Asegrese de que su pelvis se mantiene en una posicin neutral. Una versin ms avanzada incluye un bucle de tubo de goma alrededor de los tobillos para mayor resistencia. Realizar 20 a 30 repeticiones.   Shell de la almeja: Acustese sobre su lado derecho con las rodillas y los tobillos juntos, con las rodillas dobladas a unos 90 grados. Abre las piernas activando el msculo glteo superior. Asegrese de Barnes & Noblemantener una columna vertebral neutral y no el rock de su pelvis. Mantenga la cmara lenta y controlada. Una versin ms avanzada incluye poner un bucle de tubo de goma alrededor de los muslos justo por encima de la rodilla. Realizar 20 a 30 repeticiones.   Hip empuje: Acustese boca arriba con su peso en la parte superior de su espalda, entre los omplatos y los pies. Mantenga los brazos a los lados o cruzarlas sobre su pecho. Baje el trasero Barnes & Noblecasi hasta el suelo y empuje hacia arriba por la activacin de los glteos y la conduccin de los talones en el suelo. Una versin ms avanzada es la cadera de empuje de una sola pierna. [place] una pierna por lo que su peso es de una sola pierna y la espalda. Repita el mismo movimiento, asegurndose de que usted conduce el taln en el suelo y Pharmacologistmantener una pelvis estables. Realizar 20 a 30 repeticiones.   Puente Hip Side: Acustese sobre su lado con los pies apoyados en una superficie elevada unos 1-2 pies de la [place]. Empuje el pie inferior hacia abajo y levante el torso usando los msculos de la cadera, manteniendo la columna vertebral estable. Vuelve a la posicin inicial. Realice 10-30 repeticiones.   Side aleatoria: Con las rodillas ligeramente dobladas en una posicin en cuclillas-como, tener diez pasos hacia un lado. Mientras que an en la misma  direccin, tome otros 10 pasos de nuevo a su posicin inicial. Este es un juego. Una versin ms avanzada incluye una banda de [place] Bayhera o bucle de tubo de goma alrededor de sus tobillos. Debe ser lo suficientemente apretado por lo que proporciona resistencia constante durante todo el movimiento. Realizar 3-5 series.   Pistola [place]: Colocacin en una pierna, en cuclillas para que su muslo es casi paralela al suelo. Mantenga la columna en una posicin neutral y Pharmacologistmantener la cmara lenta y Hasletcontrolada, asegurando que su rodilla no se colapsa hacia adentro. Realice 5-15 repeticiones.   Caminata de la cadera: Prese sobre su pie derecho. Con la pelvis en una posicin neutral, suelte el lado izquierdo por lo que es varias pulgadas por debajo del lado derecho de su hueso plvico. Active su msculo de la cadera derecha y levante el lado izquierdo a su posicin neutral. Realice 10-30 repeticiones
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
## 2014-01-02 ENCOUNTER — Ambulatory Visit: Payer: No Typology Code available for payment source | Admitting: Family Medicine

## 2014-04-23 ENCOUNTER — Other Ambulatory Visit: Payer: Self-pay | Admitting: Family Medicine

## 2014-04-23 DIAGNOSIS — D509 Iron deficiency anemia, unspecified: Secondary | ICD-10-CM

## 2014-04-23 MED ORDER — FERROUS SULFATE 325 (65 FE) MG PO TABS: 325.0000 mg | ORAL_TABLET | Freq: Three times a day (TID) | ORAL | Status: AC

## 2014-04-23 MED ORDER — FERROUS SULFATE 325 (65 FE) MG PO TABS: 325.0000 mg | ORAL_TABLET | Freq: Three times a day (TID) | ORAL | Status: DC

## 2014-04-23 NOTE — Telephone Encounter (Signed)
Pt is calling and needs a refill on her Iron pills. She needs them to a NEW Pharmacy [facility] [address] . Stephanie Jacobsonjw

## 2014-06-04 ENCOUNTER — Ambulatory Visit: Payer: Self-pay

## 2015-09-21 ENCOUNTER — Telehealth: Payer: Self-pay | Admitting: Family Medicine

## 2015-09-21 NOTE — Telephone Encounter (Signed)
APPT. REMINDER CALL, LMTCB °

## 2015-09-22 ENCOUNTER — Ambulatory Visit: Payer: Self-pay | Admitting: Internal Medicine

## 2017-04-21 DIAGNOSIS — Z9289 Personal history of other medical treatment: Secondary | ICD-10-CM

## 2017-04-21 HISTORY — DX: Personal history of other medical treatment: Z92.89

## 2017-05-12 ENCOUNTER — Emergency Department (HOSPITAL_COMMUNITY)
Admission: EM | Admit: 2017-05-12 | Discharge: 2017-05-12 | Disposition: A | Discharge status: Home / Self Care | Payer: Self-pay | Attending: Emergency Medicine | Admitting: Emergency Medicine

## 2017-05-12 ENCOUNTER — Other Ambulatory Visit: Payer: Self-pay

## 2017-05-12 ENCOUNTER — Encounter (HOSPITAL_COMMUNITY): Payer: Self-pay

## 2017-05-12 DIAGNOSIS — M25512 Pain in left shoulder: Secondary | ICD-10-CM | POA: Insufficient documentation

## 2017-05-12 DIAGNOSIS — J45909 Unspecified asthma, uncomplicated: Secondary | ICD-10-CM | POA: Insufficient documentation

## 2017-05-12 DIAGNOSIS — D649 Anemia, unspecified: Secondary | ICD-10-CM | POA: Insufficient documentation

## 2017-05-12 DIAGNOSIS — Y9239 Other specified sports and athletic area as the place of occurrence of the external cause: Secondary | ICD-10-CM | POA: Insufficient documentation

## 2017-05-12 DIAGNOSIS — Z79899 Other long term (current) drug therapy: Secondary | ICD-10-CM | POA: Insufficient documentation

## 2017-05-12 DIAGNOSIS — R55 Syncope and collapse: Secondary | ICD-10-CM | POA: Insufficient documentation

## 2017-05-12 DIAGNOSIS — Y93B9 Activity, other involving muscle strengthening exercises: Secondary | ICD-10-CM | POA: Insufficient documentation

## 2017-05-12 DIAGNOSIS — W19XXXA Unspecified fall, initial encounter: Secondary | ICD-10-CM | POA: Insufficient documentation

## 2017-05-12 HISTORY — DX: Unspecified asthma, uncomplicated: J45.909

## 2017-05-12 HISTORY — DX: Anemia, unspecified: D64.9

## 2017-05-12 LAB — URINALYSIS, ROUTINE W REFLEX MICROSCOPIC
BILIRUBIN URINE: NEGATIVE
Glucose, UA: NEGATIVE mg/dL
KETONES UR: NEGATIVE mg/dL
LEUKOCYTES UA: NEGATIVE
NITRITE: NEGATIVE
Protein, ur: NEGATIVE mg/dL
Specific Gravity, Urine: 1.003 — ABNORMAL LOW (ref 1.005–1.030)
pH: 7 (ref 5.0–8.0)

## 2017-05-12 LAB — CBC
HEMATOCRIT: 23 % — AB (ref 36.0–46.0)
HEMOGLOBIN: 7.1 g/dL — AB (ref 12.0–15.0)
MCH: 23.5 pg — ABNORMAL LOW (ref 26.0–34.0)
MCHC: 30.9 g/dL (ref 30.0–36.0)
MCV: 76.2 fL — ABNORMAL LOW (ref 78.0–100.0)
Platelets: 261 10*3/uL (ref 150–400)
RBC: 3.02 MIL/uL — ABNORMAL LOW (ref 3.87–5.11)
RDW: 18.6 % — ABNORMAL HIGH (ref 11.5–15.5)
WBC: 10.5 10*3/uL (ref 4.0–10.5)

## 2017-05-12 LAB — BASIC METABOLIC PANEL
ANION GAP: 6 (ref 5–15)
BUN: 8 mg/dL (ref 6–20)
CO2: 21 mmol/L — ABNORMAL LOW (ref 22–32)
Calcium: 7.1 mg/dL — ABNORMAL LOW (ref 8.9–10.3)
Chloride: 107 mmol/L (ref 101–111)
Creatinine, Ser: 0.45 mg/dL (ref 0.44–1.00)
GFR calc Af Amer: >60 mL/min (ref >60)
GLUCOSE: 95 mg/dL (ref 65–99)
POTASSIUM: 4 mmol/L (ref 3.5–5.1)
Sodium: 134 mmol/L — ABNORMAL LOW (ref 135–145)

## 2017-05-12 LAB — ABO/RH: ABO/RH(D): O POS

## 2017-05-12 LAB — I-STAT BETA HCG BLOOD, ED (MC, WL, AP ONLY)

## 2017-05-12 MED ORDER — SODIUM CHLORIDE 0.9 % IV BOLUS (SEPSIS)
1000.0000 mL | Freq: Once | INTRAVENOUS | Status: AC
  Administered 2017-05-12: 1000 mL via INTRAVENOUS
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
## 2017-05-12 NOTE — ED Triage Notes (Signed)
Pt brought in [facility] from [employer] for a syncopal episode. Pt states she was walking out of her exercise class and the next thing she remembers is everyone standing above her. Pt states this has happened before as a result of her anemia. Pt states she has been on her menstrual cycle for 5-6 days with heavy bleeding and clots. Pt states this is normal for her and she takes iron pills at home. Pt is A+Ox4, c/o left shoulder pain- bruise noted to upper left back. Pt denies head/neck/back pain.

## 2017-05-12 NOTE — Discharge Instructions (Signed)
Your hemoglobin was 7.1 today. This is probably the reason why you lost consciousness. You need re-check as soon as possible, next week. Call Family Medicine to make an appointment for blood work and re-evaluation. Return to ED if symptoms worsen or persist. Continue taking iron pills.  Tu hemoglobina es 7.1 hoy. Esta es probablemente la razn por la que perdiste la conciencia. Necesita volver a verificar tu hemoglobina lo mas antes posible, al mas tardar la prxima semana. Llame a Family Medicine para hacer una cita para anlisis de [place] y reevaluacin. Regrese a la sala de emergencia si los sntomas empeoran o persisten. Continue tomando sus pastillas de hierro.

## 2017-05-14 ENCOUNTER — Encounter (HOSPITAL_COMMUNITY): Payer: Self-pay | Admitting: Emergency Medicine

## 2017-05-14 ENCOUNTER — Observation Stay (HOSPITAL_COMMUNITY)
Admission: EM | Admit: 2017-05-14 | Discharge: 2017-05-15 | Disposition: A | Discharge status: Home / Self Care | Payer: Self-pay | Attending: Obstetrics and Gynecology | Admitting: Obstetrics and Gynecology

## 2017-05-14 ENCOUNTER — Other Ambulatory Visit: Payer: Self-pay

## 2017-05-14 DIAGNOSIS — Z79899 Other long term (current) drug therapy: Secondary | ICD-10-CM | POA: Insufficient documentation

## 2017-05-14 DIAGNOSIS — D649 Anemia, unspecified: Secondary | ICD-10-CM | POA: Diagnosis present

## 2017-05-14 DIAGNOSIS — N920 Excessive and frequent menstruation with regular cycle: Secondary | ICD-10-CM

## 2017-05-14 DIAGNOSIS — J45909 Unspecified asthma, uncomplicated: Secondary | ICD-10-CM | POA: Insufficient documentation

## 2017-05-14 DIAGNOSIS — N939 Abnormal uterine and vaginal bleeding, unspecified: Secondary | ICD-10-CM

## 2017-05-14 DIAGNOSIS — N649 Disorder of breast, unspecified: Secondary | ICD-10-CM

## 2017-05-14 DIAGNOSIS — N83201 Unspecified ovarian cyst, right side: Secondary | ICD-10-CM | POA: Diagnosis present

## 2017-05-14 LAB — URINALYSIS, ROUTINE W REFLEX MICROSCOPIC
Bilirubin Urine: NEGATIVE
GLUCOSE, UA: NEGATIVE mg/dL
KETONES UR: NEGATIVE mg/dL
Leukocytes, UA: NEGATIVE
NITRITE: NEGATIVE
PH: 5 (ref 5.0–8.0)
Protein, ur: NEGATIVE mg/dL
Specific Gravity, Urine: 1.019 (ref 1.005–1.030)

## 2017-05-14 LAB — TYPE AND SCREEN
ABO/RH(D): O POS
ABO/RH(D): O POS
ANTIBODY SCREEN: NEGATIVE
Antibody Screen: NEGATIVE

## 2017-05-14 LAB — CBC WITH DIFFERENTIAL/PLATELET
BASOS ABS: 0 10*3/uL (ref 0.0–0.1)
BASOS PCT: 0 %
EOS ABS: 0.2 10*3/uL (ref 0.0–0.7)
EOS PCT: 4 %
HCT: 21.9 % — ABNORMAL LOW (ref 36.0–46.0)
HEMOGLOBIN: 6.7 g/dL — AB (ref 12.0–15.0)
Lymphocytes Relative: 30 %
Lymphs Abs: 1.7 10*3/uL (ref 0.7–4.0)
MCH: 23.8 pg — AB (ref 26.0–34.0)
MCHC: 30.6 g/dL (ref 30.0–36.0)
MCV: 77.7 fL — ABNORMAL LOW (ref 78.0–100.0)
Monocytes Absolute: 0.5 10*3/uL (ref 0.1–1.0)
Monocytes Relative: 8 %
NEUTROS PCT: 58 %
Neutro Abs: 3.3 10*3/uL (ref 1.7–7.7)
PLATELETS: 340 10*3/uL (ref 150–400)
RBC: 2.82 MIL/uL — AB (ref 3.87–5.11)
RDW: 19.7 % — ABNORMAL HIGH (ref 11.5–15.5)
WBC: 5.7 10*3/uL (ref 4.0–10.5)

## 2017-05-14 LAB — COMPREHENSIVE METABOLIC PANEL
ALBUMIN: 3.5 g/dL (ref 3.5–5.0)
ALT: 21 U/L (ref 14–54)
AST: 18 U/L (ref 15–41)
Alkaline Phosphatase: 49 U/L (ref 38–126)
Anion gap: 5 (ref 5–15)
BUN: 10 mg/dL (ref 6–20)
CHLORIDE: 106 mmol/L (ref 101–111)
CO2: 26 mmol/L (ref 22–32)
CREATININE: 0.41 mg/dL — AB (ref 0.44–1.00)
Calcium: 8.2 mg/dL — ABNORMAL LOW (ref 8.9–10.3)
GFR calc non Af Amer: >60 mL/min (ref >60)
GLUCOSE: 93 mg/dL (ref 65–99)
Potassium: 3.8 mmol/L (ref 3.5–5.1)
SODIUM: 137 mmol/L (ref 135–145)
Total Bilirubin: 0.7 mg/dL (ref 0.3–1.2)
Total Protein: 6.8 g/dL (ref 6.5–8.1)

## 2017-05-14 LAB — PREPARE RBC (CROSSMATCH)

## 2017-05-14 LAB — POC URINE PREG, ED: PREG TEST UR: NEGATIVE

## 2017-05-14 LAB — I-STAT BETA HCG BLOOD, ED (MC, WL, AP ONLY)

## 2017-05-14 MED ORDER — ACETAMINOPHEN 325 MG PO TABS: 650.0000 mg | ORAL_TABLET | ORAL | Status: DC | PRN

## 2017-05-14 MED ORDER — MEGESTROL ACETATE 40 MG PO TABS
40.0000 mg | ORAL_TABLET | Freq: Three times a day (TID) | ORAL | Status: DC
  Administered 2017-05-15 (×2): 40 mg via ORAL
  Filled 2017-05-14 (×3): qty 1

## 2017-05-14 MED ORDER — PRENATAL MULTIVITAMIN CH: 1.0000 | ORAL_TABLET | Freq: Every day | ORAL | Status: DC

## 2017-05-14 MED ORDER — SODIUM CHLORIDE 0.9 % IV SOLN
Freq: Once | INTRAVENOUS | Status: AC
  Administered 2017-05-15: via INTRAVENOUS

## 2017-05-14 MED ORDER — OXYCODONE-ACETAMINOPHEN 5-325 MG PO TABS: 1.0000 | ORAL_TABLET | ORAL | Status: DC | PRN

## 2017-05-14 MED ORDER — ACETAMINOPHEN 325 MG PO TABS
650.0000 mg | ORAL_TABLET | Freq: Once | ORAL | Status: AC
  Administered 2017-05-15: 650 mg via ORAL
  Filled 2017-05-14: qty 2

## 2017-05-14 MED ORDER — SODIUM CHLORIDE 0.9 % IV SOLN: INTRAVENOUS | Status: DC

## 2017-05-14 MED ORDER — TRANEXAMIC ACID 650 MG PO TABS
1300.0000 mg | ORAL_TABLET | Freq: Two times a day (BID) | ORAL | Status: DC
  Administered 2017-05-15 (×2): 1300 mg via ORAL
  Filled 2017-05-14 (×2): qty 2

## 2017-05-14 NOTE — H&P (Signed)
Stephanie Stanton is an 41 y.o. female. She is admitted for transfusions after second ED visit in a week for symptomatic anemia. When seen 12/22, she had clots and has had syncope while at exercise activity, but had negative orthostatics prior to d/c . HGB was 7.1. Today she returned for continued bleeding and passage of clots, and is admitted for transfusion with HGB 6.7. Prior to her last miscarriage she denies heavy menses, had miscarriage 6-7 yrs. Ago. Pt used an IUD for menstrual control til 4 yrs ago. Pertinent Gynecological History: Menses: flow is excessive with use of both pads or tampons on heaviest days Bleeding: with menses only x 5 d.  Contraception: none DES exposure: unknown Blood transfusions: now Sexually transmitted diseases: no past history Previous GYN Procedures:  Has u/s in 2015, had 150 gm uterus. Last mammogram:  Date:  Last pap:  Date: 2013 normal  OB History: G, P   Menstrual History: Menarche age:  Patient's last menstrual period was 05/07/2017 (within days).    Past Medical History:  Diagnosis Date  . Anemia   . Asthma     Past Surgical History:  Procedure Laterality Date  . CHOLECYSTECTOMY    . CT ABD W & PELVIS WO CM  08/2010   IUD in place, constipation  . NASAL SEPTUM SURGERY     deviated septum    Family History  Problem Relation Age of Onset  . Cancer Cousin        breast    Social History:  reports that  has never smoked. She does not have any smokeless tobacco history on file. She reports that she does not drink alcohol or use drugs.  Allergies:  Allergies  Allergen Reactions  . Aspirin Other (See Comments)    REACTION: unspecified    Medications Prior to Admission  Medication Sig Dispense Refill Last Dose  . albuterol (VENTOLIN HFA) 108 (90 BASE) MCG/ACT inhaler Inhale 2 puffs into the lungs every 4 (four) hours as needed. For wheeze 3 Inhaler 0 rescue at rescue  . COLLAGEN PO Take 1 tablet by mouth daily.   05/13/2017 at  Unknown time  . ferrous sulfate 325 (65 FE) MG tablet Take 1 tablet (325 mg total) by mouth 3 (three) times daily with meals. (Patient taking differently: Take 325 mg by mouth daily. ) 180 tablet 3 05/13/2017 at Unknown time  . Multiple Vitamin (MULTIVITAMIN) tablet Take 1 tablet by mouth daily.   05/13/2017 at Unknown time  . cyclobenzaprine (FLEXERIL) 10 MG tablet Take 1 tablet (10 mg total) by mouth at bedtime. (Patient not taking: Reported on 05/14/2017) 30 tablet 0 Not Taking at Unknown time  . fluticasone (FLOVENT HFA) 220 MCG/ACT inhaler Inhale 1 puff into the lungs 2 (two) times daily. (Patient not taking: Reported on 05/14/2017) 1 Inhaler 3 Not Taking at Unknown time  . norgestimate-ethinyl estradiol (ORTHO-CYCLEN,SPRINTEC,PREVIFEM) 0.25-35 MG-MCG tablet Take 1 tablet by mouth daily. (Patient not taking: Reported on 05/14/2017) 1 Package 11 Not Taking at Unknown time  . omeprazole (PRILOSEC OTC) 20 MG tablet Take 2 tablets (40 mg total) by mouth daily. 60 tablet 6 Taking  . traMADol (ULTRAM) 50 MG tablet Take 1 tablet (50 mg total) by mouth every 8 (eight) hours as needed. (Patient not taking: Reported on 05/14/2017) 30 tablet 0 Not Taking at Unknown time    ROS  Blood pressure (!) 96/49, pulse 73, temperature 98.2 F (36.8 C), temperature source Oral, resp. rate 16, height 5\' 3"  (1.6 m), weight  131 lb (59.4 kg), last menstrual period 05/07/2017, SpO2 100 %. Physical Exam  Constitutional: She is oriented to person, place, and time. She appears well-developed and well-nourished. No distress.  HENT:  Head: Normocephalic.  Eyes: Pupils are equal, round, and reactive to light.  Neck: Neck supple.  Cardiovascular: Normal rate.  Respiratory: Effort normal.  GI: Soft. She exhibits no distension and no mass. There is no tenderness. There is no rebound and no guarding.  Genitourinary:  Genitourinary Comments: See ED provider.  Musculoskeletal: Normal range of motion.  Neurological: She is  alert and oriented to person, place, and time.  Skin: Skin is warm and dry. No rash noted. She is not diaphoretic. No erythema. No pallor.  Psychiatric: She has a normal mood and affect. Her behavior is normal. Thought content normal.    Results for orders placed or performed during the hospital encounter of 05/14/17 (from the past 24 hour(s))  CBC with Differential     Status: Abnormal   Collection Time: 05/14/17  9:15 AM  Result Value Ref Range   WBC 5.7 4.0 - 10.5 K/uL   RBC 2.82 (L) 3.87 - 5.11 MIL/uL   Hemoglobin 6.7 (LL) 12.0 - 15.0 g/dL   HCT 60.421.9 (L) 54.036.0 - 98.146.0 %   MCV 77.7 (L) 78.0 - 100.0 fL   MCH 23.8 (L) 26.0 - 34.0 pg   MCHC 30.6 30.0 - 36.0 g/dL   RDW 19.119.7 (H) 47.811.5 - 29.515.5 %   Platelets 340 150 - 400 K/uL   Neutrophils Relative % 58 %   Neutro Abs 3.3 1.7 - 7.7 K/uL   Lymphocytes Relative 30 %   Lymphs Abs 1.7 0.7 - 4.0 K/uL   Monocytes Relative 8 %   Monocytes Absolute 0.5 0.1 - 1.0 K/uL   Eosinophils Relative 4 %   Eosinophils Absolute 0.2 0.0 - 0.7 K/uL   Basophils Relative 0 %   Basophils Absolute 0.0 0.0 - 0.1 K/uL  Comprehensive metabolic panel     Status: Abnormal   Collection Time: 05/14/17  9:15 AM  Result Value Ref Range   Sodium 137 135 - 145 mmol/L   Potassium 3.8 3.5 - 5.1 mmol/L   Chloride 106 101 - 111 mmol/L   CO2 26 22 - 32 mmol/L   Glucose, Bld 93 65 - 99 mg/dL   BUN 10 6 - 20 mg/dL   Creatinine, Ser 6.210.41 (L) 0.44 - 1.00 mg/dL   Calcium 8.2 (L) 8.9 - 10.3 mg/dL   Total Protein 6.8 6.5 - 8.1 g/dL   Albumin 3.5 3.5 - 5.0 g/dL   AST 18 15 - 41 U/L   ALT 21 14 - 54 U/L   Alkaline Phosphatase 49 38 - 126 U/L   Total Bilirubin 0.7 0.3 - 1.2 mg/dL   GFR calc non Af Amer >60 >60 mL/min   GFR calc Af Amer >60 >60 mL/min   Anion gap 5 5 - 15  Type and screen [facility]     Status: None   Collection Time: 05/14/17  9:30 AM  Result Value Ref Range   ABO/RH(D) O POS    Antibody Screen NEG    Sample Expiration 05/17/2017    I-Stat beta hCG blood, ED     Status: None   Collection Time: 05/14/17  9:51 AM  Result Value Ref Range   I-stat hCG, quantitative <5.0 <5 mIU/mL   Comment 3          Urinalysis, Routine w  reflex microscopic     Status: Abnormal   Collection Time: 05/14/17  7:01 PM  Result Value Ref Range   Color, Urine YELLOW YELLOW   APPearance CLEAR CLEAR   Specific Gravity, Urine 1.019 1.005 - 1.030   pH 5.0 5.0 - 8.0   Glucose, UA NEGATIVE NEGATIVE mg/dL   Hgb urine dipstick MODERATE (A) NEGATIVE   Bilirubin Urine NEGATIVE NEGATIVE   Ketones, ur NEGATIVE NEGATIVE mg/dL   Protein, ur NEGATIVE NEGATIVE mg/dL   Nitrite NEGATIVE NEGATIVE   Leukocytes, UA NEGATIVE NEGATIVE   RBC / HPF 0-5 0 - 5 RBC/hpf   WBC, UA 0-5 0 - 5 WBC/hpf   Bacteria, UA RARE (A) NONE SEEN   Squamous Epithelial / LPF 0-5 (A) NONE SEEN   Mucus PRESENT   POC urine preg, ED     Status: None   Collection Time: 05/14/17  7:57 PM  Result Value Ref Range   Preg Test, Ur NEGATIVE NEGATIVE    No results found.  Assessment/Plan: Menorrhagia with anemia. Plan: Transfuse 2 units prbc. Lysteda 1 gm iv Megace 40 mg tid. Pelvic u/s in a.m. Tilda BurrowJohn V Lariya Kinzie 05/14/2017, 9:06 PM
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
## 2017-05-14 NOTE — ED Notes (Signed)
Talked with patient about delay and told her we continue to check on transport status. Patient stated she understood.

## 2017-05-14 NOTE — ED Provider Notes (Signed)
Complains of generalized weakness for 1 week accompanied by vaginal bleeding.  Other associated symptoms include mild nausea.  No vomiting no fever.  No other associated symptoms.  She denies pain anywhere.   Doug SouJacubowitz, Niranjan Rufener, MD 05/14/17 1213

## 2017-05-14 NOTE — ED Triage Notes (Signed)
States was seen here 2 days ago for same vag bleeding and dizziness states it is all worse vag bleeding cont at  Heavy rate  , states has gone thru 5 pads yesterday  Still having clots

## 2017-05-14 NOTE — ED Notes (Signed)
Explained about delay to patient. Patient stated she understood.

## 2017-05-14 NOTE — ED Notes (Signed)
[facility] at the bedside to transport [facility].

## 2017-05-15 ENCOUNTER — Observation Stay (HOSPITAL_COMMUNITY): Payer: Self-pay

## 2017-05-15 DIAGNOSIS — N83201 Unspecified ovarian cyst, right side: Secondary | ICD-10-CM | POA: Diagnosis present

## 2017-05-15 DIAGNOSIS — N649 Disorder of breast, unspecified: Secondary | ICD-10-CM

## 2017-05-15 DIAGNOSIS — N920 Excessive and frequent menstruation with regular cycle: Principal | ICD-10-CM

## 2017-05-15 LAB — CBC WITH DIFFERENTIAL/PLATELET
BASOS ABS: 0 10*3/uL (ref 0.0–0.1)
BASOS PCT: 0 %
Eosinophils Absolute: 0.3 10*3/uL (ref 0.0–0.7)
Eosinophils Relative: 5 %
HEMATOCRIT: 27.2 % — AB (ref 36.0–46.0)
HEMOGLOBIN: 8.8 g/dL — AB (ref 12.0–15.0)
Lymphocytes Relative: 33 %
Lymphs Abs: 2.2 10*3/uL (ref 0.7–4.0)
MCH: 25.9 pg — ABNORMAL LOW (ref 26.0–34.0)
MCHC: 32.4 g/dL (ref 30.0–36.0)
MCV: 80 fL (ref 78.0–100.0)
Monocytes Absolute: 0.4 10*3/uL (ref 0.1–1.0)
Monocytes Relative: 7 %
NEUTROS ABS: 3.7 10*3/uL (ref 1.7–7.7)
NEUTROS PCT: 55 %
Platelets: 303 10*3/uL (ref 150–400)
RBC: 3.4 MIL/uL — ABNORMAL LOW (ref 3.87–5.11)
RDW: 17.8 % — ABNORMAL HIGH (ref 11.5–15.5)
WBC: 6.7 10*3/uL (ref 4.0–10.5)

## 2017-05-15 MED ORDER — MEGESTROL ACETATE 40 MG PO TABS: 40.0000 mg | ORAL_TABLET | Freq: Two times a day (BID) | ORAL | 1 refills | Status: DC

## 2017-05-15 NOTE — Care Management Note (Signed)
Case Management Note  Patient Details  Name: Midge Miniumrika Perez-Velazquez MRN: [id number] Date of Birth: 01/17/1976  Subjective/Objective:                  Menorrhagia with regular menses.  Symptomatic anemia.  Action/Plan: Will need outpt GYN PCP.  Additional Comments: Case Manager notified by SW of above need.  CM called and spoke with the Pt's Nurse who stated that Pt was just getting back from ultrasound.  CM explained that Pt would need outpt GYN PCP but unable to set that up today.  Unsure if Pt is to dc today.  Nurse to verify home address and phone number so that CM can follow up tomorrow once physician offices are open.  Nurse will verify information.  Roseanne RenoJohnson, [place], [place]   [phone] 05/15/2017, 10:14 AM

## 2017-05-15 NOTE — Treatment Plan (Signed)
Dr. Emelda FearFerguson to the bedside. Interpretor services via [employer] used. Blossom Hoopslejandro [phone]) used. All aspects of consent reviewed and pt. Communicated understanding & Consented to transfusion.  Will proceed with pRBC 2 units.

## 2017-05-15 NOTE — Progress Notes (Addendum)
Subjective:  S/p transfusion 2 units prbc. Clotting has stopped, and light bleeding , on Lysteda, 1300 mg bid, plus Megace  Patient reports tolerating PO.   Feels much better Objective: I have reviewed patient's vital signs and labs. CBC    Component Value Date/Time   WBC 5.7 05/14/2017 0915   RBC 2.82 (L) 05/14/2017 0915   HGB 6.7 (LL) 05/14/2017 0915   HCT 21.9 (L) 05/14/2017 0915   PLT 340 05/14/2017 0915   MCV 77.7 (L) 05/14/2017 0915   MCH 23.8 (L) 05/14/2017 0915   MCHC 30.6 05/14/2017 0915   RDW 19.7 (H) 05/14/2017 0915   LYMPHSABS 1.7 05/14/2017 0915   MONOABS 0.5 05/14/2017 0915   EOSABS 0.2 05/14/2017 0915   BASOSABS 0.0 05/14/2017 0915   Followup cbc  CBC    Component Value Date/Time   WBC 6.7 05/15/2017 0750   RBC 3.40 (L) 05/15/2017 0750   HGB 8.8 (L) 05/15/2017 0750   HCT 27.2 (L) 05/15/2017 0750   PLT 303 05/15/2017 0750   MCV 80.0 05/15/2017 0750   MCH 25.9 (L) 05/15/2017 0750   MCHC 32.4 05/15/2017 0750   RDW 17.8 (H) 05/15/2017 0750   LYMPHSABS 2.2 05/15/2017 0750   MONOABS 0.4 05/15/2017 0750   EOSABS 0.3 05/15/2017 0750   BASOSABS 0.0 05/15/2017 0750    General: alert, cooperative and no distress GI: soft, non-tender; bowel sounds normal; no masses,  no organomegaly Vaginal Bleeding: minimal   Assessment/Plan: Menorrhagia with regular menses.  Symptomatic anemia.  Plan:   U/s ordered this am. Discharge home after u/s Pt would be candidate for OCP or POP's to control menses.  LOS: 1 day    Tilda BurrowJohn V Zanna Hawn 05/15/2017, 7:33 AM

## 2017-05-15 NOTE — Discharge Summary (Signed)
Physician Discharge Summary  Patient ID: Stephanie Stanton MRN: [id number] DOB/AGE: 43/09/1975 43 y.o.  Admit date: 05/14/2017 Discharge date: 05/15/2017   Discharge Diagnoses:  Principal Problem:   Menorrhagia with regular cycle Active Problems:   Anemia   Vaginal bleeding   Right ovarian cyst  Consults: None  Significant Diagnostic Studies:  CBC Latest Ref Rng & Units 05/15/2017 05/14/2017 05/12/2017  WBC 4.0 - 10.5 K/uL 6.7 5.7 10.5  Hemoglobin 12.0 - 15.0 g/dL 3.0(Q8.8(L) 6.7(LL) 7.1(L)  Hematocrit 36.0 - 46.0 % 27.2(L) 21.9(L) 23.0(L)  Platelets 150 - 400 K/uL 303 340 261   CMP Latest Ref Rng & Units 05/14/2017 05/12/2017 10/26/2011  Glucose 65 - 99 mg/dL 93 95 97  BUN 6 - 20 mg/dL 10 8 6   Creatinine 0.44 - 1.00 mg/dL 6.57(Q0.41(L) 4.690.45 6.29(B0.48(L)  Sodium 135 - 145 mmol/L 137 134(L) 138  Potassium 3.5 - 5.1 mmol/L 3.8 4.0 3.9  Chloride 101 - 111 mmol/L 106 107 105  CO2 22 - 32 mmol/L 26 21(L) 26  Calcium 8.9 - 10.3 mg/dL 8.2(L) 7.1(L) 8.6  Total Protein 6.5 - 8.1 g/dL 6.8 - -  Total Bilirubin 0.3 - 1.2 mg/dL 0.7 - -  Alkaline Phos 38 - 126 U/L 49 - -  AST 15 - 41 U/L 18 - -  ALT 14 - 54 U/L 21 - -     [place] Pelvic Complete With Transvaginal  Result Date: 05/15/2017 CLINICAL DATA:  Menorrhagia with regular cycles EXAM: TRANSABDOMINAL AND TRANSVAGINAL ULTRASOUND OF PELVIS TECHNIQUE: Both transabdominal and transvaginal ultrasound examinations of the pelvis were performed. Transabdominal technique was performed for global imaging of the pelvis including uterus, ovaries, adnexal regions, and pelvic cul-de-sac. It was necessary to proceed with endovaginal exam following the transabdominal exam to visualize the endometrium. COMPARISON:  None FINDINGS: Uterus Measurements: 11.2 x 7.3 x 7.4 cm. No fibroids or other mass visualized. Endometrium Thickness: 12 mm in thickness.  No focal abnormality visualized. Right ovary Measurements: 6.0 x 4.7 x 5.6 cm. 4.5 x 4.4 x 5.2 cm minimally complex  septated cyst. Left ovary Measurements: 3.5 x 1.5 x 2.6 cm. Normal appearance/no adnexal mass. Other findings No abnormal free fluid. IMPRESSION: 5.2 cm minimally complex right ovarian cyst. This is almost certainly benign, but follow up ultrasound is recommended in 1 year according to the Society of Radiologists in Ultrasound2010 Consensus Conference Statement (D Lenis NoonLevine et al. Management of Asymptomatic Ovarian and Other Adnexal Cysts Imaged at US: Society of Radiologists in Ultrasound Consensus Conference Statement 2010. Radiology 256 (Sept 2010): 943-954.). Electronically Signed   By: Charlett NoseKevin  Dover M.D.   On: 05/15/2017 10:29     Hospital Course: Patient admitted with symptomatic anemia following 2 trips to ER in 48 hours with similar symptoms. Having vaginal bleeding. Placed on Lysteda and Megace and given 2 u PRBC's. Bleeding improved. U/s revealed right ovarian cyst, but no obvious cause of bleeding. Deemed stable for discharge.  Disposition: 01-Home or Self Care  Discharged Condition: improved  Discharge Instructions    Call MD for:  difficulty breathing, headache or visual disturbances   Complete by:  As directed    Call MD for:  extreme fatigue   Complete by:  As directed    Call MD for:  persistant dizziness or light-headedness   Complete by:  As directed    Diet - low sodium heart healthy   Complete by:  As directed    Increase activity slowly   Complete by:  As directed  Allergies as of 05/15/2017      Reactions   Aspirin Other (See Comments)   REACTION: unspecified      Medication List    STOP taking these medications   cyclobenzaprine 10 MG tablet Commonly known as:  FLEXERIL   fluticasone 220 MCG/ACT inhaler Commonly known as:  FLOVENT HFA   norgestimate-ethinyl estradiol 0.25-35 MG-MCG tablet Commonly known as:  ORTHO-CYCLEN,SPRINTEC,PREVIFEM   traMADol 50 MG tablet Commonly known as:  ULTRAM     TAKE these medications   albuterol 108 (90 Base) MCG/ACT  inhaler Commonly known as:  VENTOLIN HFA Inhale 2 puffs into the lungs every 4 (four) hours as needed. For wheeze   COLLAGEN PO Take 1 tablet by mouth daily.   ferrous sulfate 325 (65 FE) MG tablet Take 1 tablet (325 mg total) by mouth 3 (three) times daily with meals. What changed:  when to take this   megestrol 40 MG tablet Commonly known as:  MEGACE Take 1 tablet (40 mg total) by mouth 2 (two) times daily. During menses   multivitamin tablet Take 1 tablet by mouth daily.   omeprazole 20 MG tablet Commonly known as:  PRILOSEC OTC Take 2 tablets (40 mg total) by mouth daily.      Follow-up Information    [facility]   Specialty:  Obstetrics and Gynecology Why:  They will call you with this appointment Contact information: [address] [place] [id number] [phone]          Signed: Reva Boresanya S Claira Jeter 05/15/2017, 10:58 AM

## 2017-05-15 NOTE — Discharge Instructions (Signed)
Sangrado uterino anormal  (Abnormal Uterine Bleeding)  El sangrado uterino anormal puede afectar a las mujeres que están en diversas etapas de la vida, desde adolescentes, mujeres fértiles y mujeres embarazadas, hasta mujeres que han llegado a la menopausia. Hay diversas clases de sangrado uterino que se consideran anormales, entre ellas:  · Pérdidas de sangre o hemorragias entre los períodos.  · Hemorragias luego de mantener relaciones sexuales.  · Sangrado abundante o más que lo habitual.  · Períodos que duran más que lo normal.  · Sangrado luego de la menopausia.  Muchos casos de sangrado uterino anormal son leves y simples de tratar, mientras que otros son más graves. El médico debe evaluar cualquier clase de sangrado anormal. El tratamiento dependerá de la causa del sangrado.  INSTRUCCIONES PARA EL CUIDADO EN EL HOGAR  Controle su afección para ver si hay cambios. Las siguientes indicaciones ayudarán a aliviar cualquier molestia que pueda sentir:  · Evite las duchas vaginales y el uso de tampones según las indicaciones del médico.  · Cámbiese las compresas con frecuencia.  Deberá hacerse exámenes pélvicos regulares y pruebas de Papanicolaou. Cumpla con todas las visitas de control y exámanes diagnósticos, según le indique su médico.  SOLICITE ATENCIÓN MÉDICA SI:  · El sangrado dura más de 1 semana.  · Se siente mareada por momentos.    SOLICITE ATENCIÓN MÉDICA DE INMEDIATO SI:  · Se desmaya.  · Debe cambiarse la compresa cada 15 a 30 minutos.  · Siente dolor abdominal.  · Tiene fiebre.  · Se siente débil o presenta sudoración.  · Elimina coágulos grandes por la vagina.  · Comienza a sentir náuseas y vomita.    ASEGÚRESE DE QUE:  · Comprende estas instrucciones.  · Controlará su afección.  · Recibirá ayuda de inmediato si no mejora o si empeora.    Esta información no tiene como fin reemplazar el consejo del médico. Asegúrese de hacerle al médico cualquier pregunta que tenga.  Document Released: 05/08/2005  Document Revised: 05/13/2013 Document Reviewed: 12/05/2012  Elsevier Interactive Patient Education © 2017 Elsevier Inc.

## 2017-05-15 NOTE — Progress Notes (Signed)
CSW acknowledges consult and contact case management to assist with GYN PCP follow-up.  Blaine HamperAngel Boyd-Gilyard, MSW, LCSW Clinical Social Work [phone]

## 2017-05-16 ENCOUNTER — Telehealth (HOSPITAL_COMMUNITY): Payer: Self-pay | Admitting: Emergency Medicine

## 2017-05-16 LAB — TYPE AND SCREEN
ABO/RH(D): O POS
Antibody Screen: NEGATIVE
UNIT DIVISION: 0
Unit division: 0
Unit division: 0

## 2017-05-16 LAB — BPAM RBC
BLOOD PRODUCT EXPIRATION DATE: 201812282359
BLOOD PRODUCT EXPIRATION DATE: 201901082359
Blood Product Expiration Date: 201901082359
ISSUE DATE / TIME: 201812250048
ISSUE DATE / TIME: 201812250322
UNIT TYPE AND RH: 5100
Unit Type and Rh: 5100
Unit Type and Rh: 9500

## 2017-05-16 LAB — GC/CHLAMYDIA PROBE AMP (~~LOC~~) NOT AT ARMC
Chlamydia: NEGATIVE
NEISSERIA GONORRHEA: NEGATIVE

## 2017-05-16 LAB — CBG MONITORING, ED: GLUCOSE-CAPILLARY: 93 mg/dL (ref 65–99)

## 2017-05-16 NOTE — Telephone Encounter (Signed)
Pt presented to ED two days after initial presentation with continued bleeding and worsening symptomatic anemia. She required admission and transfusion. Message from Dr. Emelda FearFerguson noted. Appreciate feedback.

## 2017-05-16 NOTE — Telephone Encounter (Signed)
-----   Message from Tilda BurrowJohn Ferguson V, MD sent at 05/14/2017  8:52 PM EST ----- Regarding: anemia, syncope. Readmitted for transfusions after continued bleeding. A couple of options to consider:to slow bleeding as outpatient. Megace 40 mg tid until bleeding stops, 4 dollar list. Lysteda ( tranexemic acid) oral.1300 mg bid, $$$ (probably insured pt's )

## 2017-05-17 NOTE — Care Management Note (Signed)
Case Management Note  Patient Details  Name: Stephanie Miniumrika Stanton MRN: [id number] Date of Birth: 08/05/1975  Additional Comments: Case Manager [facility] with [facility] to inform them of need for 3 wk follow up appt per the dc summary.  They will also need to follow for GYN issues per MD CM consult.  Stephanie Stanton voiced understanding and will make follow up appt for the pt and notify her of appt date and time.   Stephanie Stanton, Stephanie Stanton, [place]   [phone] 05/17/2017, 10:54 AM

## 2017-05-23 ENCOUNTER — Other Ambulatory Visit: Payer: Self-pay

## 2017-05-23 ENCOUNTER — Encounter (HOSPITAL_COMMUNITY): Payer: Self-pay | Admitting: *Deleted

## 2017-05-23 ENCOUNTER — Inpatient Hospital Stay (HOSPITAL_COMMUNITY)
Admission: AD | Admit: 2017-05-23 | Discharge: 2017-05-23 | Disposition: A | Discharge status: Home / Self Care | Payer: Self-pay | Source: Ambulatory Visit | Attending: Obstetrics and Gynecology | Admitting: Obstetrics and Gynecology

## 2017-05-23 DIAGNOSIS — R52 Pain, unspecified: Secondary | ICD-10-CM

## 2017-05-23 DIAGNOSIS — R531 Weakness: Secondary | ICD-10-CM | POA: Insufficient documentation

## 2017-05-23 DIAGNOSIS — M791 Myalgia, unspecified site: Secondary | ICD-10-CM | POA: Insufficient documentation

## 2017-05-23 HISTORY — DX: Encounter for other specified aftercare: Z51.89

## 2017-05-23 LAB — URINALYSIS, ROUTINE W REFLEX MICROSCOPIC
BILIRUBIN URINE: NEGATIVE
Glucose, UA: NEGATIVE mg/dL
KETONES UR: NEGATIVE mg/dL
LEUKOCYTES UA: NEGATIVE
NITRITE: NEGATIVE
PROTEIN: NEGATIVE mg/dL
Specific Gravity, Urine: 1.009 (ref 1.005–1.030)
pH: 7 (ref 5.0–8.0)

## 2017-05-23 LAB — CBC
HCT: 30 % — ABNORMAL LOW (ref 36.0–46.0)
HEMOGLOBIN: 9.4 g/dL — AB (ref 12.0–15.0)
MCH: 26.4 pg (ref 26.0–34.0)
MCHC: 31.3 g/dL (ref 30.0–36.0)
MCV: 84.3 fL (ref 78.0–100.0)
PLATELETS: 359 10*3/uL (ref 150–400)
RBC: 3.56 MIL/uL — ABNORMAL LOW (ref 3.87–5.11)
RDW: 21.3 % — ABNORMAL HIGH (ref 11.5–15.5)
WBC: 8.1 10*3/uL (ref 4.0–10.5)

## 2017-05-23 LAB — POCT PREGNANCY, URINE
Preg Test, Ur: NEGATIVE
Preg Test, Ur: NEGATIVE

## 2017-05-23 NOTE — MAU Note (Signed)
Pt refused influenza swab
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
## 2017-05-23 NOTE — Discharge Instructions (Signed)
Gripe en los adultos (Influenza, Adult) La gripe es una infeccin en los pulmones, la nariz y la garganta (vas respiratorias). La causa un virus. La gripe provoca muchos sntomas del resfro comn, as como fiebre alta y dolor corporal. Puede hacer que se sienta muy mal. Se transmite fcilmente de persona a persona (es contagiosa). La mejor manera de prevenir la gripe es aplicndose la vacuna contra la gripe todos los aos. CUIDADOS EN EL HOGAR  Tome los medicamentos de venta libre y los recetados solamente como se lo haya indicado el mdico.  Use un humidificador de aire fro para que el aire de su casa est ms hmedo. Esto puede facilitar la respiracin.  Descanse todo lo que sea necesario.  Beba suficiente lquido para mantener el pis (orina) claro o de color amarillo plido.  Al toser o estornudar, cbrase la boca y la nariz.  Lvese las manos con agua y jabn frecuentemente, en especial despus de toser o estornudar. Use un desinfectante para manos si no dispone de agua y jabn.  Qudese en su casa y no concurra al trabajo o a la escuela, como se lo haya indicado el mdico. A menos que deba ir al mdico, evite salir de su casa hasta que no tenga fiebre durante 24horas sin el uso de medicamentos.  Concurra a todas las visitas de control como se lo haya indicado el mdico. Esto es importante.  PREVENCIN  Aplicarse la vacuna anual contra la gripe es la mejor manera de evitar contagiarse la gripe. Puede aplicarse la vacuna contra la gripe a fines de verano, en otoo o en invierno. Pregntele al mdico cundo debe aplicarse la vacuna contra la gripe.  Lvese las manos o use un desinfectante de manos con frecuencia.  Evite el contacto con personas que estn enfermas durante la temporada de resfro y gripe.  Consuma alimentos saludables.  Beba abundantes lquidos.  Duerma lo suficiente.  Haga ejercicios regularmente.  SOLICITE AYUDA SI:  Tiene sntomas nuevos.  Tiene los  siguientes sntomas: ? Dolor en el pecho. ? Deposiciones lquidas (diarrea). ? Fiebre.  La tos empeora.  Empieza a tener ms mucosidad.  Siente malestar estomacal (nuseas).  Vomita.  SOLICITE AYUDA DE INMEDIATO SI:  Siente que le falta el aire o tiene dificultad para respirar.  La piel o las uas se tornan de un color azulado.  Presenta un dolor intenso o rigidez en el cuello.  Siente dolor de cabeza de forma repentina.  Le duele la cara o el odo de forma repentina.  No puede detener los vmitos.  Esta informacin no tiene como fin reemplazar el consejo del mdico. Asegrese de hacerle al mdico cualquier pregunta que tenga. Document Released: 08/04/2008 Document Revised: 08/30/2015 Document Reviewed: 03/02/2015 Elsevier Interactive Patient Education  2017 Elsevier Inc.  

## 2017-05-23 NOTE — MAU Provider Note (Signed)
History     CSN: [id number]  Arrival date and time: 05/23/17 2011   First Provider Initiated Contact with Patient 05/23/17 2115      Chief Complaint  Patient presents with  . Fever   HPI Stephanie Stanton is a 42 y.o. [id number] non pregnant female who presents with aching and fever at home. She states the symptoms started this am. She took nyquil at 1500 for "feeling hot" because she thought she had a fever. She denies any cough, sore throat or congestion. She is concerned because she had a blood transfusion 2 weeks ago and is worried the fever is from that.   OB History    Gravida Para Term Preterm AB Living   9 5 5   4 5    SAB TAB Ectopic Multiple Live Births   2 2            Past Medical History:  Diagnosis Date  . Anemia   . Asthma   . Blood transfusion without reported diagnosis     Past Surgical History:  Procedure Laterality Date  . CHOLECYSTECTOMY    . CT ABD W & PELVIS WO CM  08/2010   IUD in place, constipation  . NASAL SEPTUM SURGERY     deviated septum  . WISDOM TOOTH EXTRACTION      Family History  Problem Relation Age of Onset  . Cancer Cousin        breast    Social History   Tobacco Use  . Smoking status: Never Smoker  . Smokeless tobacco: Never Used  Substance Use Topics  . Alcohol use: No  . Drug use: No    Allergies:  Allergies  Allergen Reactions  . Aspirin Other (See Comments)    REACTION: unspecified    Medications Prior to Admission  Medication Sig Dispense Refill Last Dose  . COLLAGEN PO Take 1 tablet by mouth daily.   05/23/2017 at Unknown time  . ferrous sulfate 325 (65 FE) MG tablet Take 1 tablet (325 mg total) by mouth 3 (three) times daily with meals. (Patient taking differently: Take 325 mg by mouth daily. ) 180 tablet 3 05/23/2017 at Unknown time  . megestrol (MEGACE) 40 MG tablet Take 1 tablet (40 mg total) by mouth 2 (two) times daily. During menses 30 tablet 1 05/22/2017 at Unknown time  . Multiple Vitamin  (MULTIVITAMIN) tablet Take 1 tablet by mouth daily.   05/23/2017 at Unknown time  . albuterol (VENTOLIN HFA) 108 (90 BASE) MCG/ACT inhaler Inhale 2 puffs into the lungs every 4 (four) hours as needed. For wheeze 3 Inhaler 0 rescue at rescue  . omeprazole (PRILOSEC OTC) 20 MG tablet Take 2 tablets (40 mg total) by mouth daily. 60 tablet 6 Taking    Review of Systems  Constitutional: Positive for chills and fever. Negative for fatigue.  HENT: Negative.  Negative for sore throat.   Respiratory: Negative.  Negative for cough and shortness of breath.   Cardiovascular: Negative.  Negative for chest pain.  Gastrointestinal: Negative.  Negative for abdominal pain, constipation, diarrhea, nausea and vomiting.  Genitourinary: Negative.  Negative for dysuria.  Neurological: Negative.  Negative for dizziness and headaches.   Physical Exam   Blood pressure 108/63, pulse 97, temperature 99.2 F (37.3 C), temperature source Oral, resp. rate 18, height 5\' 3"  (1.6 m), weight 136 lb (61.7 kg), last menstrual period 05/07/2017, SpO2 97 %.  Physical Exam  Nursing note and vitals reviewed. Constitutional: She is oriented to person,  place, and time. She appears well-developed and well-nourished. No distress.  HENT:  Head: Normocephalic.  Eyes: Pupils are equal, round, and reactive to light.  Cardiovascular: Normal rate, regular rhythm and normal heart sounds.  Respiratory: Effort normal and breath sounds normal. No respiratory distress.  GI: Soft. Bowel sounds are normal. She exhibits no distension. There is no tenderness.  Neurological: She is alert and oriented to person, place, and time.  Skin: Skin is warm and dry.  Psychiatric: She has a normal mood and affect. Her behavior is normal. Judgment and thought content normal.    MAU Course  Procedures Results for orders placed or performed during the hospital encounter of 05/23/17 (from the past 24 hour(s))  Urinalysis, Routine w reflex microscopic      Status: Abnormal   Collection Time: 05/23/17  8:16 PM  Result Value Ref Range   Color, Urine STRAW (A) YELLOW   APPearance CLEAR CLEAR   Specific Gravity, Urine 1.009 1.005 - 1.030   pH 7.0 5.0 - 8.0   Glucose, UA NEGATIVE NEGATIVE mg/dL   Hgb urine dipstick SMALL (A) NEGATIVE   Bilirubin Urine NEGATIVE NEGATIVE   Ketones, ur NEGATIVE NEGATIVE mg/dL   Protein, ur NEGATIVE NEGATIVE mg/dL   Nitrite NEGATIVE NEGATIVE   Leukocytes, UA NEGATIVE NEGATIVE   RBC / HPF 0-5 0 - 5 RBC/hpf   WBC, UA 0-5 0 - 5 WBC/hpf   Bacteria, UA RARE (A) NONE SEEN   Squamous Epithelial / LPF 0-5 (A) NONE SEEN   Mucus PRESENT   Pregnancy, urine POC     Status: None   Collection Time: 05/23/17  8:38 PM  Result Value Ref Range   Preg Test, Ur NEGATIVE NEGATIVE  Pregnancy, urine POC     Status: None   Collection Time: 05/23/17  8:39 PM  Result Value Ref Range   Preg Test, Ur NEGATIVE NEGATIVE  CBC     Status: Abnormal   Collection Time: 05/23/17  9:18 PM  Result Value Ref Range   WBC 8.1 4.0 - 10.5 K/uL   RBC 3.56 (L) 3.87 - 5.11 MIL/uL   Hemoglobin 9.4 (L) 12.0 - 15.0 g/dL   HCT 95.230.0 (L) 84.136.0 - 32.446.0 %   MCV 84.3 78.0 - 100.0 fL   MCH 26.4 26.0 - 34.0 pg   MCHC 31.3 30.0 - 36.0 g/dL   RDW 40.121.3 (H) 02.711.5 - 25.315.5 %   Platelets 359 150 - 400 K/uL   MDM UA, UPT CBC Influenza PCR- patient refused. Explained to patient reasons for test and risks, patient still declined Patient declined any further work up after blood work was done. States primary concern was infection from blood transfusion and did not know when her follow up appointment was. Assessment and Plan   1. Generalized body aches   2. Weakness    -Discharge home in stable condition -Influenza precautions discussed -Patient advised to follow-up with PCP or ED if symptoms worsen and go to appointment with Dr. Marice Potterove on 1/18 -Patient may return to MAU as needed or if her condition were to change or worsen  Rolm BookbinderCaroline M Jadelyn Elks CNM 05/23/2017, 9:44  PM

## 2017-06-08 ENCOUNTER — Other Ambulatory Visit (HOSPITAL_COMMUNITY)
Admission: RE | Admit: 2017-06-08 | Discharge: 2017-06-08 | Disposition: A | Discharge status: Home / Self Care | Payer: Self-pay | Source: Ambulatory Visit | Attending: Obstetrics & Gynecology | Admitting: Obstetrics & Gynecology

## 2017-06-08 ENCOUNTER — Encounter: Payer: Self-pay | Admitting: Obstetrics & Gynecology

## 2017-06-08 ENCOUNTER — Ambulatory Visit (INDEPENDENT_AMBULATORY_CARE_PROVIDER_SITE_OTHER): Payer: Self-pay | Admitting: Obstetrics & Gynecology

## 2017-06-08 VITALS — BP 123/67 | HR 83 | Ht 63.0 in | Wt 136.9 lb

## 2017-06-08 DIAGNOSIS — Z124 Encounter for screening for malignant neoplasm of cervix: Secondary | ICD-10-CM

## 2017-06-08 DIAGNOSIS — Z113 Encounter for screening for infections with a predominantly sexual mode of transmission: Secondary | ICD-10-CM

## 2017-06-08 DIAGNOSIS — Z1151 Encounter for screening for human papillomavirus (HPV): Secondary | ICD-10-CM

## 2017-06-08 DIAGNOSIS — N921 Excessive and frequent menstruation with irregular cycle: Secondary | ICD-10-CM | POA: Insufficient documentation

## 2017-06-08 DIAGNOSIS — N83201 Unspecified ovarian cyst, right side: Secondary | ICD-10-CM

## 2017-06-08 DIAGNOSIS — Z3042 Encounter for surveillance of injectable contraceptive: Secondary | ICD-10-CM

## 2017-06-08 LAB — POCT PREGNANCY, URINE: Preg Test, Ur: NEGATIVE

## 2017-06-08 MED ORDER — MEDROXYPROGESTERONE ACETATE 150 MG/ML IM SUSP
150.0000 mg | Freq: Once | INTRAMUSCULAR | Status: AC
  Administered 2017-06-08: 150 mg via INTRAMUSCULAR

## 2017-06-08 NOTE — Progress Notes (Addendum)
Patient ID: Stephanie Stanton, female   DOB: Feb 28, 1976, 42 y.o.   MRN: [id number]  No chief complaint on file.   HPI Stephanie Stanton is a 42 y.o. female.  Separated Hispanic P5 here with constant vaginal bleeding since 05/07/17. She was seen in the MAU and transfused 2 units of PRBCs Her u/s showed a 6 cm slightly complex right ovarian cyst but no uterine pathology was noted. She was given megace and says that her flow has decreased but she is still bleeding. She had an occurrence of this in the past and says that depo provera helped. She would like to try that again.  HPI  Past Medical History:  Diagnosis Date  . Anemia   . Asthma   . Blood transfusion without reported diagnosis     Past Surgical History:  Procedure Laterality Date  . CHOLECYSTECTOMY    . CT ABD W & PELVIS WO CM  08/2010   IUD in place, constipation  . NASAL SEPTUM SURGERY     deviated septum  . WISDOM TOOTH EXTRACTION      Family History  Problem Relation Age of Onset  . Cancer Cousin        breast    Social History Social History   Tobacco Use  . Smoking status: Never Smoker  . Smokeless tobacco: Never Used  Substance Use Topics  . Alcohol use: No  . Drug use: No    Allergies  Allergen Reactions  . Aspirin Other (See Comments)    REACTION: unspecified    Current Outpatient Medications  Medication Sig Dispense Refill  . albuterol (VENTOLIN HFA) 108 (90 BASE) MCG/ACT inhaler Inhale 2 puffs into the lungs every 4 (four) hours as needed. For wheeze 3 Inhaler 0  . COLLAGEN PO Take 1 tablet by mouth daily.    . ferrous sulfate 325 (65 FE) MG tablet Take 1 tablet (325 mg total) by mouth 3 (three) times daily with meals. (Patient taking differently: Take 325 mg by mouth daily. ) 180 tablet 3  . megestrol (MEGACE) 40 MG tablet Take 1 tablet (40 mg total) by mouth 2 (two) times daily. During menses 30 tablet 1  . Multiple Vitamin (MULTIVITAMIN) tablet Take 1 tablet by mouth daily.    Marland Kitchen  omeprazole (PRILOSEC OTC) 20 MG tablet Take 2 tablets (40 mg total) by mouth daily. 60 tablet 6   No current facility-administered medications for this visit.     Review of Systems Review of Systems  She has been abstinent for at least 3 years. For contraception in the past she used an IUD.  There were no vitals taken for this visit.  Physical Exam Physical Exam Well nourished, well hydrated Hispanic female, no apparent distress Breathing, conversing, and ambulating normally  UPT negative, consent signed, time out done Cervix prepped with betadine and grasped with a single tooth tenaculum Uterus sounded to 9 cm Pipelle used for 2 passes with a moderate amount of tissue obtained. She tolerated the procedure well.  Her bimanual exam revealed an upper limit of normal size uterus, retroverted, moderate mobility Pubic arch probably ok for TVH prn  Data Reviewed  IMPRESSION: 5.2 cm minimally complex right ovarian cyst. This is almost certainly benign, but follow up ultrasound is recommended in 1 year according to the Society of Radiologists in Ultrasound2010 Consensus Conference Statement Grier Rocher et al. Management of Asymptomatic Ovarian and Other Adnexal Cysts Imaged at [place]: Society of Radiologists in Ultrasound Consensus Conference Statement 2010. Radiology 256 (Sept  2010): 943-954.).  Assessment    DUB Right ovarian cyst    Plan   check TSH, CBC Await pathology Check CA- 125 Come back in about 2 weeks Depo provera today Stop megace        Haiden Rawlinson C Jacquez Sheetz 06/08/2017, 8:19 AM

## 2017-06-08 NOTE — Addendum Note (Signed)
Addended by: Mikey BussingWILSON, CHIQUITA L on: 06/08/2017 09:15 AM   Modules accepted: Orders

## 2017-06-09 LAB — CBC
HEMATOCRIT: 33.7 % — AB (ref 34.0–46.6)
HEMOGLOBIN: 10.2 g/dL — AB (ref 11.1–15.9)
MCH: 26.8 pg (ref 26.6–33.0)
MCHC: 30.3 g/dL — ABNORMAL LOW (ref 31.5–35.7)
MCV: 89 fL (ref 79–97)
PLATELETS: 463 10*3/uL — AB (ref 150–379)
RBC: 3.81 x10E6/uL (ref 3.77–5.28)
RDW: 17.9 % — ABNORMAL HIGH (ref 12.3–15.4)
WBC: 4.6 10*3/uL (ref 3.4–10.8)

## 2017-06-09 LAB — TSH: TSH: 2.32 u[IU]/mL (ref 0.450–4.500)

## 2017-06-12 ENCOUNTER — Encounter: Payer: Self-pay | Admitting: *Deleted

## 2017-06-13 LAB — CYTOLOGY - PAP
Chlamydia: NEGATIVE
Diagnosis: NEGATIVE
HPV (WINDOPATH): NOT DETECTED
Neisseria Gonorrhea: NEGATIVE

## 2017-06-25 ENCOUNTER — Encounter: Payer: Self-pay | Admitting: Obstetrics & Gynecology

## 2017-06-25 ENCOUNTER — Ambulatory Visit (INDEPENDENT_AMBULATORY_CARE_PROVIDER_SITE_OTHER): Payer: Self-pay | Admitting: Clinical

## 2017-06-25 ENCOUNTER — Ambulatory Visit (INDEPENDENT_AMBULATORY_CARE_PROVIDER_SITE_OTHER): Payer: Self-pay | Admitting: Obstetrics & Gynecology

## 2017-06-25 VITALS — BP 127/73 | HR 85 | Wt 136.0 lb

## 2017-06-25 DIAGNOSIS — N83209 Unspecified ovarian cyst, unspecified side: Secondary | ICD-10-CM

## 2017-06-25 DIAGNOSIS — R5383 Other fatigue: Secondary | ICD-10-CM

## 2017-06-25 DIAGNOSIS — F4321 Adjustment disorder with depressed mood: Secondary | ICD-10-CM

## 2017-06-25 MED ORDER — NORGESTREL-ETHINYL ESTRADIOL 0.3-30 MG-MCG PO TABS: 1.0000 | ORAL_TABLET | Freq: Every day | ORAL | 11 refills | Status: DC

## 2017-06-25 NOTE — BH Specialist Note (Signed)
Integrated Behavioral Health Initial Visit  MRN: [id number] Name: Stephanie Stanton  Number of Integrated Behavioral Health Clinician visits:: 1/6 Session Start time: 11:50  Session End time: 12:10 Total time: 20 minutes  Type of Service: Integrated Behavioral Health- Individual/Family Interpretor:No. Interpretor Name and Language: n/a   Warm Hand Off Completed.       SUBJECTIVE: Stephanie Stanton is a 42 y.o. female accompanied by n/a Patient was referred by Dr Marice Potterove for symptoms of depression Patient reports the following symptoms/concerns: Pt states her primary concern today is that she is feeling low energy to exercise, as she previously did, and difficulty falling asleep at night. Pt does not attribute these symptoms to her emotions, but feels it is related to perimenopause. She began taking melatonin for less than two weeks.  Duration of problem: Increasing in over one month; Severity of problem: moderate  OBJECTIVE: Mood: Appropriate and Affect: Appropriate Risk of harm to self or others: No plan to harm self or others t LIFE CONTEXT: Family and Social: Pt lives with 44yo; has four grown children School/Work: Works Economistfulltime Self-Care: Used to enjoy being physically active; no substances Life Changes: Perimenopause  GOALS ADDRESSED: Patient will: 1. Reduce symptoms of: depression 2. Increase knowledge and/or ability of: self-management skills  3. Demonstrate ability to: Increase healthy adjustment to current life circumstances  INTERVENTIONS: Interventions utilized: Behavioral Activation  Standardized Assessments completed: GAD-7 and PHQ 9  ASSESSMENT: Patient currently experiencing Adjustment disorder with depressed mood.   Patient may benefit from psychoeducation and brief therapeutic intervention regarding coping with symptoms of depressed mood.  PLAN: 1. Follow up with behavioral health clinician on : As needed 2. Behavioral recommendations:  -Sit  outside for 20 minutes every morning(even on cloudy days); consider walking during at least 5 minutes of that time -Continue taking melatonin at bedtime, as recommended by medical provider; discuss further at next visit with medical provider -Use sleep app nightly; continue as long as it remains helpful for sleep -Continue taking iron, as recommended by medical provider -Read educational material regarding coping with symptoms of depression 3. Referral(s): [facility] 4. "From scale of 1-10, how likely are you to follow plan?": 9  Rae LipsJamie C Daking Westervelt, LCSW  Dep[facility] screen [facility] 06/08/2017 07/29/2013  Decreased Interest 3 2  Down, Depressed, Hopeless 1 1  PHQ - 2 Score 4 3  Altered sleeping 2 -  Tired, decreased energy 1 -  Change in appetite 1 -  Feeling bad or failure about yourself  0 -  Trouble concentrating 1 -  Moving slowly or fidgety/restless 1 -  Suicidal thoughts 0 -  PHQ-9 Score 10 -   GAD 7 : Generalized Anxiety Score 06/08/2017  Nervous, Anxious, on Edge 0  Control/stop worrying 0  Worry too much - different things 1  Trouble relaxing 1  Restless 1  Easily annoyed or irritable 1  Afraid - awful might happen 0  Total GAD 7 Score 4

## 2017-06-25 NOTE — Progress Notes (Signed)
   Subjective:    Patient ID: Midge MiniumErika Perez-Velazquez, female    DOB: 02/01/1976, 42 y.o.   MRN: [id number]  HPI  42 yo Hispanic P5 (abstinent) here for discussion of results of work up done for menorrhagia/DUB. She has bled for almost 2 months. Her u/s showed a normal uterus, her EMBX was normal as was her TSH. Her hbg was 10.2, previously 9.4. She had a depo provera shot 2 weeks ago. Previously she had been on megace with no help. With the depo, the bleeding is less heavy. She feels very tired.  Review of Systems     Objective:   Physical Exam  Breathing, conversing, and ambulating normally Well nourished, well hydrated Hispanic female, no apparent distress Abd- benign     Assessment & Plan:  Ovarian cyst- check CA-125 Fatigue- check Vit B12 and Vit D DUB- slightly better with depo provera, however, will add OCPs for 1 month trial. Continue MVI daily She will see Asher MuirJamie today due to stress

## 2017-06-26 LAB — VITAMIN B12: VITAMIN B 12: 901 pg/mL (ref 232–1245)

## 2017-06-26 LAB — CA 125: Cancer Antigen (CA) 125: 85.6 U/mL — ABNORMAL HIGH (ref 0.0–38.1)

## 2017-06-26 LAB — VITAMIN D 25 HYDROXY (VIT D DEFICIENCY, FRACTURES): VIT D 25 HYDROXY: 26.2 ng/mL — AB (ref 30.0–100.0)

## 2017-07-16 ENCOUNTER — Other Ambulatory Visit: Payer: Self-pay | Admitting: *Deleted

## 2017-07-16 DIAGNOSIS — E559 Vitamin D deficiency, unspecified: Secondary | ICD-10-CM

## 2017-07-16 DIAGNOSIS — R971 Elevated cancer antigen 125 [CA 125]: Secondary | ICD-10-CM

## 2017-07-16 DIAGNOSIS — N83209 Unspecified ovarian cyst, unspecified side: Secondary | ICD-10-CM

## 2017-07-16 MED ORDER — VITAMIN D (ERGOCALCIFEROL) 1.25 MG (50000 UNIT) PO CAPS: 50000.0000 [IU] | ORAL_CAPSULE | ORAL | 0 refills | Status: DC

## 2017-07-17 ENCOUNTER — Other Ambulatory Visit: Payer: Self-pay

## 2017-07-17 ENCOUNTER — Encounter (HOSPITAL_COMMUNITY): Payer: Self-pay | Admitting: *Deleted

## 2017-07-17 ENCOUNTER — Inpatient Hospital Stay (HOSPITAL_COMMUNITY)
Admission: AD | Admit: 2017-07-17 | Discharge: 2017-07-17 | Disposition: A | Discharge status: Home / Self Care | Payer: Self-pay | Source: Ambulatory Visit | Attending: Obstetrics & Gynecology | Admitting: Obstetrics & Gynecology

## 2017-07-17 DIAGNOSIS — D500 Iron deficiency anemia secondary to blood loss (chronic): Secondary | ICD-10-CM | POA: Insufficient documentation

## 2017-07-17 DIAGNOSIS — Z87891 Personal history of nicotine dependence: Secondary | ICD-10-CM | POA: Insufficient documentation

## 2017-07-17 DIAGNOSIS — Z9049 Acquired absence of other specified parts of digestive tract: Secondary | ICD-10-CM | POA: Insufficient documentation

## 2017-07-17 DIAGNOSIS — Z79899 Other long term (current) drug therapy: Secondary | ICD-10-CM | POA: Insufficient documentation

## 2017-07-17 DIAGNOSIS — Z9889 Other specified postprocedural states: Secondary | ICD-10-CM | POA: Insufficient documentation

## 2017-07-17 DIAGNOSIS — N939 Abnormal uterine and vaginal bleeding, unspecified: Secondary | ICD-10-CM | POA: Insufficient documentation

## 2017-07-17 DIAGNOSIS — Z886 Allergy status to analgesic agent status: Secondary | ICD-10-CM | POA: Insufficient documentation

## 2017-07-17 LAB — URINALYSIS, ROUTINE W REFLEX MICROSCOPIC
Bilirubin Urine: NEGATIVE
GLUCOSE, UA: NEGATIVE mg/dL
KETONES UR: NEGATIVE mg/dL
LEUKOCYTES UA: NEGATIVE
Nitrite: NEGATIVE
PH: 5 (ref 5.0–8.0)
Protein, ur: 30 mg/dL — AB
SPECIFIC GRAVITY, URINE: 1.032 — AB (ref 1.005–1.030)
Squamous Epithelial / LPF: NONE SEEN
WBC UA: NONE SEEN WBC/hpf (ref 0–5)

## 2017-07-17 LAB — CBC
HEMATOCRIT: 28.6 % — AB (ref 36.0–46.0)
Hemoglobin: 9.1 g/dL — ABNORMAL LOW (ref 12.0–15.0)
MCH: 26.1 pg (ref 26.0–34.0)
MCHC: 31.8 g/dL (ref 30.0–36.0)
MCV: 81.9 fL (ref 78.0–100.0)
Platelets: 341 10*3/uL (ref 150–400)
RBC: 3.49 MIL/uL — AB (ref 3.87–5.11)
RDW: 14.5 % (ref 11.5–15.5)
WBC: 7.1 10*3/uL (ref 4.0–10.5)

## 2017-07-17 LAB — POCT PREGNANCY, URINE: Preg Test, Ur: NEGATIVE

## 2017-07-17 MED ORDER — MEGESTROL ACETATE 40 MG PO TABS: 80.0000 mg | ORAL_TABLET | Freq: Two times a day (BID) | ORAL | 0 refills | Status: DC

## 2017-07-17 MED ORDER — MEGESTROL ACETATE 40 MG PO TABS
80.0000 mg | ORAL_TABLET | Freq: Every day | ORAL | Status: DC
  Administered 2017-07-17: 80 mg via ORAL
  Filled 2017-07-17 (×2): qty 2

## 2017-07-17 MED ORDER — SODIUM CHLORIDE 0.9 % IV SOLN
INTRAVENOUS | Status: DC
  Administered 2017-07-17: 21:00:00 via INTRAVENOUS

## 2017-07-17 NOTE — MAU Note (Signed)
Has bleeding since Dec.  Was given pills and depo, has helped, became lighter, but 3 days ago returned heavy with clots.

## 2017-07-17 NOTE — Discharge Instructions (Signed)
Sangrado uterino anormal °(Abnormal Uterine Bleeding) °Sangrado uterino anormal significa que hay un sangrado por la vagina que no es su período menstrual normal. Puede ser: °· Pérdidas de sangre o hemorragias entre los períodos. °· Hemorragias luego de tener sexo (relaciones sexuales). °· Sangrado abundante o más que lo habitual. °· Períodos que duran más que lo normal. °· Sangrado luego de la menopausia. °Hay muchos problemas que pueden ser la causa. El tratamiento dependerá de la causa del sangrado. Cualquier tipo de sangrado que no sea normal debe consultarse con el médico. °CUIDADOS EN EL HOGAR °Controle su afección para ver si hay cambios. Estas indicaciones podrán disminuir cualquier molestia que tenga: °· No use tampones ni duchas vaginales o como le haya indicado el médico. °· Cambie los apósitos con frecuencia. °Deberá hacerse exámenes pélvicos regulares y pruebas de Papanicolaou. Realice los estudios indicados según le indique su médico. °SOLICITE AYUDA SI: °· El sangrado dura más de 1 semana. °· Se siente mareada por momentos. ° °SOLICITE AYUDA DE INMEDIATO SI: °· Se desmaya. °· Tiene que cambiarse los apósitos cada 15 a 30 minutos. °· Siente dolor en el abdomen. °· Tiene fiebre. °· Se siente débil o presenta sudoración. °· Elimina coágulos grandes por la vagina. °· Siente malestar estomacal (náuseas) y devuelve (vomita). ° °ASEGÚRESE DE QUE: °· Comprende estas instrucciones. °· Controlará su afección. °· Recibirá ayuda de inmediato si no mejora o si empeora. ° °Esta información no tiene como fin reemplazar el consejo del médico. Asegúrese de hacerle al médico cualquier pregunta que tenga. °Document Released: 06/10/2010 Document Revised: 05/13/2013 Document Reviewed: 12/05/2012 °Elsevier Interactive Patient Education © 2017 Elsevier Inc. ° °

## 2017-07-17 NOTE — MAU Provider Note (Signed)
History     CSN: [id number]  Arrival date and time: 07/17/17 1646   First Provider Initiated Contact with Patient 07/17/17 1940      Chief Complaint  Patient presents with  . Vaginal Bleeding  . Abdominal Pain   HPI   Ms.Stephanie Stanton is a 42 y.o. female [phone] non pregnant female here with abnormal bleeding. Says she has changed her pad seven times today. Says the bleeding is soaking her pad and her clothes. Says she see's Dr. Marice Potter in the office. Last office visit was this month. She is currently using Depo and did a 1 month trial of birthcontrol pills with the Depo that she feels did not work.  Says she is taking iron pills 1 X per day.   OB History    Gravida Para Term Preterm AB Living   9 5 5   4 5    SAB TAB Ectopic Multiple Live Births   2 2            Past Medical History:  Diagnosis Date  . Anemia   . Asthma   . Blood transfusion without reported diagnosis     Past Surgical History:  Procedure Laterality Date  . CHOLECYSTECTOMY    . CT ABD W & PELVIS WO CM  08/2010   IUD in place, constipation  . NASAL SEPTUM SURGERY     deviated septum  . WISDOM TOOTH EXTRACTION      Family History  Problem Relation Age of Onset  . Cancer Cousin        breast  . Diabetes Mother   . Hypertension Mother     Social History   Tobacco Use  . Smoking status: Former Games developer  . Smokeless tobacco: Never Used  Substance Use Topics  . Alcohol use: No  . Drug use: No    Allergies:  Allergies  Allergen Reactions  . Aspirin Other (See Comments)    REACTION: unspecified    Medications Prior to Admission  Medication Sig Dispense Refill Last Dose  . ferrous sulfate 325 (65 FE) MG tablet Take 1 tablet (325 mg total) by mouth 3 (three) times daily with meals. 180 tablet 3 07/16/2017 at 0900  . megestrol (MEGACE) 40 MG tablet Take 1 tablet (40 mg total) by mouth 2 (two) times daily. During menses 30 tablet 1 Past Month at Unknown time  . Multiple Vitamin  (MULTIVITAMIN) tablet Take 1 tablet by mouth daily.   07/16/2017 at 0900  . COLLAGEN PO Take 1 tablet by mouth daily.   Unknown at Unknown time  . norgestrel-ethinyl estradiol (LO/OVRAL,CRYSELLE) 0.3-30 MG-MCG tablet Take 1 tablet by mouth daily. 1 Package 11 Unknown at Unknown time  . Vitamin D, Ergocalciferol, (DRISDOL) 50000 units CAPS capsule Take 1 capsule (50,000 Units total) by mouth every 7 (seven) days. 8 capsule 0 Unknown at Unknown time   Results for orders placed or performed during the hospital encounter of 07/17/17 (from the past 48 hour(s))  Urinalysis, Routine w reflex microscopic     Status: Abnormal   Collection Time: 07/17/17  4:58 PM  Result Value Ref Range   Color, Urine YELLOW YELLOW   APPearance TURBID (A) CLEAR   Specific Gravity, Urine 1.032 (H) 1.005 - 1.030   pH 5.0 5.0 - 8.0   Glucose, UA NEGATIVE NEGATIVE mg/dL   Hgb urine dipstick LARGE (A) NEGATIVE   Bilirubin Urine NEGATIVE NEGATIVE   Ketones, ur NEGATIVE NEGATIVE mg/dL   Protein, ur 30 (A) NEGATIVE mg/dL  Nitrite NEGATIVE NEGATIVE   Leukocytes, UA NEGATIVE NEGATIVE   RBC / HPF TOO NUMEROUS TO COUNT 0 - 5 RBC/hpf   WBC, UA NONE SEEN 0 - 5 WBC/hpf   Bacteria, UA RARE (A) NONE SEEN   Squamous Epithelial / LPF NONE SEEN NONE SEEN   Mucus PRESENT    Ca Oxalate Crys, UA PRESENT     Comment: Performed at [facility], [address] [place], [place] [address]  Pregnancy, urine POC     Status: None   Collection Time: 07/17/17  5:31 PM  Result Value Ref Range   Preg Test, Ur NEGATIVE NEGATIVE    Comment:        THE SENSITIVITY OF THIS METHODOLOGY IS >24 mIU/mL   CBC     Status: Abnormal   Collection Time: 07/17/17  5:43 PM  Result Value Ref Range   WBC 7.1 4.0 - 10.5 K/uL   RBC 3.49 (L) 3.87 - 5.11 MIL/uL   Hemoglobin 9.1 (L) 12.0 - 15.0 g/dL   HCT 40.928.6 (L) 81.136.0 - 91.446.0 %   MCV 81.9 78.0 - 100.0 fL   MCH 26.1 26.0 - 34.0 pg   MCHC 31.8 30.0 - 36.0 g/dL   RDW 78.214.5 95.611.5 - 21.315.5 %   Platelets 341  150 - 400 K/uL    Comment: Performed at [facility], [address] [place], [place] [address]    Review of Systems  Constitutional: Positive for fatigue.  Neurological: Negative for headaches. Dizziness: In the morning.    Physical Exam   Blood pressure 103/65, pulse 83, temperature 98.9 F (37.2 C), temperature source Oral, resp. rate 16, weight 143 lb 12 oz (65.2 kg), last menstrual period 05/07/2017, SpO2 100 %.  Physical Exam  Constitutional: She is oriented to person, place, and time. She appears well-developed and well-nourished. No distress.  HENT:  Head: Normocephalic.  Eyes: Pupils are equal, round, and reactive to light.  Cardiovascular: Normal rate.  Respiratory: Effort normal and breath sounds normal.  Musculoskeletal: Normal range of motion.  Neurological: She is alert and oriented to person, place, and time.  Skin: Skin is warm. She is not diaphoretic.  Psychiatric: Her behavior is normal.   MAU Course  Procedures  None  MDM  Initial orthostatic vitals + 1 liter of fluid given with 80 mg of Megace Follow up orthostatic vitals normal Patient up to the bathroom without dizziness.  Discussed with Dr. Adrian BlackwaterStinson  Assessment and Plan   A:  Abnormal vaginal bleeding  Iron deficiency anemia due to chronic blood loss   P:   Discharge home with strict return precautions Rx: Megace Continue iron daily Call the office tomorrow for follow up with GYN  Duane Lopeasch, Avantae Bither I, NP 07/18/2017 3:51 PM

## 2017-07-23 ENCOUNTER — Ambulatory Visit (INDEPENDENT_AMBULATORY_CARE_PROVIDER_SITE_OTHER): Payer: Self-pay | Admitting: Obstetrics & Gynecology

## 2017-07-23 ENCOUNTER — Encounter: Payer: Self-pay | Admitting: Obstetrics & Gynecology

## 2017-07-23 VITALS — BP 119/56 | HR 82 | Wt 142.0 lb

## 2017-07-23 DIAGNOSIS — N921 Excessive and frequent menstruation with irregular cycle: Secondary | ICD-10-CM

## 2017-07-23 DIAGNOSIS — R971 Elevated cancer antigen 125 [CA 125]: Secondary | ICD-10-CM

## 2017-07-23 DIAGNOSIS — N83201 Unspecified ovarian cyst, right side: Secondary | ICD-10-CM

## 2017-07-23 NOTE — Progress Notes (Signed)
   Subjective:    Patient ID: Stephanie Stanton, female    DOB: 05/20/1976, 42 y.o.   MRN: [id number]  HPI 42 yo separated Hispanic P5 here for follow up after her EMBX and CA-125. The ultrasound showed a complex right ovarian cyst, felt to be benign. However, her CA-125 is 85. She continues to bleed in spite of depo provera, megace 80 mg daily. She would like to have a hysterectomy.   Review of Systems     Objective:   Physical Exam Breathing, conversing, and ambulating normally Well nourished, well hydrated Hispanic female, no apparent distress     Assessment & Plan:  Right ovarian cyst with elevated CA-125- refer to gyn onc Menorrhagia with anemia, s/p transfusion- her EMBX was negative

## 2017-08-01 ENCOUNTER — Encounter: Payer: Self-pay | Admitting: Gynecologic Oncology

## 2017-08-01 ENCOUNTER — Inpatient Hospital Stay: Payer: Self-pay | Attending: Gynecologic Oncology | Admitting: Gynecologic Oncology

## 2017-08-01 ENCOUNTER — Inpatient Hospital Stay: Payer: Self-pay

## 2017-08-01 VITALS — BP 107/53 | HR 78 | Temp 98.3°F | Resp 18 | Ht 63.0 in | Wt 146.7 lb

## 2017-08-01 DIAGNOSIS — N83201 Unspecified ovarian cyst, right side: Secondary | ICD-10-CM | POA: Insufficient documentation

## 2017-08-01 DIAGNOSIS — R971 Elevated cancer antigen 125 [CA 125]: Secondary | ICD-10-CM | POA: Insufficient documentation

## 2017-08-01 DIAGNOSIS — N939 Abnormal uterine and vaginal bleeding, unspecified: Secondary | ICD-10-CM | POA: Insufficient documentation

## 2017-08-01 DIAGNOSIS — D500 Iron deficiency anemia secondary to blood loss (chronic): Secondary | ICD-10-CM

## 2017-08-01 DIAGNOSIS — Z87891 Personal history of nicotine dependence: Secondary | ICD-10-CM | POA: Insufficient documentation

## 2017-08-01 LAB — CBC WITH DIFFERENTIAL (CANCER CENTER ONLY)
BASOS ABS: 0.1 10*3/uL (ref 0.0–0.1)
BASOS PCT: 1 %
Eosinophils Absolute: 0.3 10*3/uL (ref 0.0–0.5)
Eosinophils Relative: 6 %
HEMATOCRIT: 33.2 % — AB (ref 34.8–46.6)
Hemoglobin: 10.4 g/dL — ABNORMAL LOW (ref 11.6–15.9)
Lymphocytes Relative: 31 %
Lymphs Abs: 1.8 10*3/uL (ref 0.9–3.3)
MCH: 25.3 pg (ref 25.1–34.0)
MCHC: 31.5 g/dL (ref 31.5–36.0)
MCV: 80.3 fL (ref 79.5–101.0)
MONO ABS: 0.6 10*3/uL (ref 0.1–0.9)
Monocytes Relative: 10 %
NEUTROS ABS: 3.2 10*3/uL (ref 1.5–6.5)
Neutrophils Relative %: 52 %
Platelet Count: 476 10*3/uL — ABNORMAL HIGH (ref 145–400)
RBC: 4.13 MIL/uL (ref 3.70–5.45)
RDW: 16.4 % — AB (ref 11.2–14.5)
WBC: 6 10*3/uL (ref 3.9–10.3)

## 2017-08-01 NOTE — Progress Notes (Signed)
Consult Note: Gyn-Onc  Consult was requested by Dr. Marice Potterove for the evaluation of Stephanie Stanton 42 y.o. female  CC:  Chief Complaint  Patient presents with  . Elevated CA-125  . Cyst of right ovary    Assessment/Plan:  Stephanie Stanton  is a 42 y.o.  year old with an enlarged uterus (11cm) abnormal uterine bleeding, and a 6cm right ovarian cyst. Her CA 125 is elevated to 86.  I discussed with Alcario Droughtrica that I had a low suspicion that she has a malignancy however I do believe it is appropriate for us to proceed with hysterectomy and unilateral salpingo-oophorectomy given her symptomatic abnormal uterine bleeding.  We will perform frozen section of her ovarian mass at the time of surgery and if malignancy was identified would perform a left salpingo-oophorectomy and staging.  Given her symptoms of abnormal bleeding since a D&C for miscarriage we will check an hCG today to make sure there is no occult stational trophoblastic neoplasia.  We will also check a CBC to ensure that she is not severely anemic in light of her recent blood transfusion for menorrhagia.  I discussed hormonal options such as a Mirena IUD or changing the oral progestin dose.  She is not interested in medical therapy.  I discussed surgical intervention and its risks including  bleeding, infection, damage to internal organs (such as bladder,ureters, bowels), blood clot, reoperation and rehospitalization.  She is interested in proceeding with hysterectomy unilateral salpingo-oophorectomy in the last week of April. She understands that hysterectomy will result in permanent infertility.   HPI: Stephanie Stanton is a 42 year old who is seen in consultation at the request of Dr Marice Potterove for abnormal uterine bleeding, a right ovarian cyst and elevated CA 125.  Patient reports abnormal uterine bleeding following a miscarriage D&C approximate 5 years ago.  The bleeding was cyclic until December 2018 when she began  bleeding every day quite heavily.  She was [facility] at [facility] on May 07, 2017 and was transfused 2 units of packed red blood cells.  An ultrasound scan was performed at that time which showed a 6 cm slightly complex right ovarian cyst but no uterine pathology was noted.  She was given Megace which did decrease the volume of flow but she continues to still have daily vaginal bleeding.  An endometrial biopsy performed by Dr. Marice Potterove on June 08, 2017 revealed benign endometrium with hormone effect but no hyperplasia or malignancy.  The ultrasound from May 15, 2017 revealed a uterus measuring 11.2 x 7.3 x 7.4 cm with a 12 mm endometrial thickness.  The right ovary measured 6 x 4.7 x 5.6 cm with a minimally complex septated cyst measuring 5.2 cm.  The left ovary was grossly normal.  The impression of the cyst was that it was likely benign.  Ca1 25 was drawn on June 25, 2017 and was slightly elevated at 85.6.    The patient has no significant family history of malignancy.  She has a history of 5 prior vaginal deliveries.  She has had only a cholecystectomy as a abdominal surgery.  She is a remote history of an IUD use.  Current Meds:  Outpatient Encounter Medications as of 08/01/2017  Medication Sig  . ferrous sulfate 325 (65 FE) MG tablet Take 1 tablet (325 mg total) by mouth 3 (three) times daily with meals. (Patient taking differently: Take 325 mg by mouth 2 (two) times daily with a meal. )  . Multiple Vitamin (  MULTIVITAMIN) tablet Take 1 tablet by mouth daily.  . [DISCONTINUED] ibuprofen (ADVIL,MOTRIN) 200 MG tablet Take 400 mg by mouth every 6 (six) hours as needed for moderate pain.  . [DISCONTINUED] megestrol (MEGACE) 40 MG tablet Take 2 tablets (80 mg total) by mouth 2 (two) times daily.  . [DISCONTINUED] Vitamin D, Ergocalciferol, (DRISDOL) 50000 units CAPS capsule Take 1 capsule (50,000 Units total) by mouth every 7 (seven) days. (Patient not taking: Reported on  07/17/2017)   No facility-administered encounter medications on file as of 08/01/2017.     Allergy:  Allergies  Allergen Reactions  . Aspirin Anaphylaxis    Social Hx:   Social History   Socioeconomic History  . Marital status: Married    Spouse name: Not on file  . Number of children: Not on file  . Years of education: Not on file  . Highest education level: Not on file  Social Needs  . Financial resource strain: Not on file  . Food insecurity - worry: Not on file  . Food insecurity - inability: Not on file  . Transportation needs - medical: Not on file  . Transportation needs - non-medical: Not on file  Occupational History  . Not on file  Tobacco Use  . Smoking status: Former Games developer  . Smokeless tobacco: Never Used  Substance and Sexual Activity  . Alcohol use: No  . Drug use: No  . Sexual activity: No    Partners: Male    Birth control/protection: None  Other Topics Concern  . Not on file  Social History Narrative  . Not on file    Past Surgical Hx:  Past Surgical History:  Procedure Laterality Date  . CHOLECYSTECTOMY    . CT ABD W & PELVIS WO CM  08/2010   IUD in place, constipation  . NASAL SEPTUM SURGERY     deviated septum  . WISDOM TOOTH EXTRACTION      Past Medical Hx:  Past Medical History:  Diagnosis Date  . Anemia   . Asthma   . Blood transfusion without reported diagnosis     Past Gynecological History:   No LMP recorded.  Family Hx:  Family History  Problem Relation Age of Onset  . Cancer Cousin        breast  . Diabetes Mother   . Hypertension Mother     Review of Systems:  Constitutional  Feels well,    ENT Normal appearing ears and nares bilaterally Skin/Breast  No rash, sores, jaundice, itching, dryness Cardiovascular  No chest pain, shortness of breath, or edema  Pulmonary  No cough or wheeze.  Gastro Intestinal  No nausea, vomitting, or diarrhoea. No bright red blood per rectum, no abdominal pain, change in bowel  movement, or constipation.  Genito Urinary  No frequency, urgency, dysuria, + bleeding Musculo Skeletal  No myalgia, arthralgia, joint swelling or pain  Neurologic  No weakness, numbness, change in gait,  Psychology  No depression, anxiety, insomnia.   Vitals:  Blood pressure (!) 107/53, pulse 78, temperature 98.3 F (36.8 C), temperature source Oral, resp. rate 18, height 5\' 3"  (1.6 m), weight 146 lb 11.2 oz (66.5 kg), SpO2 100 %.  Physical Exam: WD in NAD Neck  Supple NROM, without any enlargements.  Lymph Node Survey No cervical supraclavicular or inguinal adenopathy Cardiovascular  Pulse normal rate, regularity and rhythm. S1 and S2 normal.  Lungs  Clear to auscultation bilateraly, without wheezes/crackles/rhonchi. Good air movement.  Skin  No rash/lesions/breakdown  Psychiatry  Alert and oriented to person, place, and time  Abdomen  Normoactive bowel sounds, abdomen soft, non-tender and thin, no palpable masses without evidence of hernia.  Back No CVA tenderness Genito Urinary  Vulva/vagina: Normal external female genitalia.   No lesions. No discharge or bleeding.  Bladder/urethra:  No lesions or masses, well supported bladder  Vagina: normal  Cervix: Normal appearing, no lesions.  Uterus:  Slightly bulky, retroverted, mobile, no parametrial involvement or nodularity.  Adnexa: fullness in right adnexa.  Rectal  Good tone, no masses no cul de sac nodularity.  Extremities  No bilateral cyanosis, clubbing or edema.   Luisa Dago, MD  08/01/2017, 2:28 PM

## 2017-08-01 NOTE — Patient Instructions (Signed)
Preparing for your Surgery  Plan for surgery on Sep 19, 2017 with Dr. Adolphus BirchwoodEmma Stanton at [facility].  You will be scheduled for a robotic assisted total hysterectomy, unilateral salpingo-oophorectomy, possible staging.  Pre-operative Testing -You will receive a phone call from presurgical testing at [facility] to arrange for a pre-operative testing appointment before your surgery.  This appointment normally occurs one to two weeks before your scheduled surgery.   -Bring your insurance card, copy of an advanced directive if applicable, medication list  -At that visit, you will be asked to sign a consent for a possible blood transfusion in case a transfusion becomes necessary during surgery.  The need for a blood transfusion is rare but having consent is a necessary part of your care.     -You should not be taking blood thinners or aspirin at least ten days prior to surgery unless instructed by your surgeon.  Day Before Surgery at Home -You will be asked to take in a light diet the day before surgery.  Avoid carbonated beverages.  You will be advised to have nothing to eat or drink after midnight the evening before.    Eat a light diet the day before surgery.  Examples including soups, broths, toast, yogurt, mashed potatoes.  Things to avoid include carbonated beverages (fizzy beverages), raw fruits and raw vegetables, or beans.   If your bowels are filled with gas, your surgeon will have difficulty visualizing your pelvic organs which increases your surgical risks.  Your role in recovery Your role is to become active as soon as directed by your doctor, while still giving yourself time to heal.  Rest when you feel tired. You will be asked to do the following in order to speed your recovery:  - Cough and breathe deeply. This helps toclear and expand your lungs and can prevent pneumonia. You may be given a spirometer to practice deep breathing. A staff member will show  you how to use the spirometer. - Do mild physical activity. Walking or moving your legs help your circulation and body functions return to normal. A staff member will help you when you try to walk and will provide you with simple exercises. Do not try to get up or walk alone the first time. - Actively manage your pain. Managing your pain lets you move in comfort. We will ask you to rate your pain on a scale of zero to 10. It is your responsibility to tell your doctor or nurse where and how much you hurt so your pain can be treated.  Special Considerations -If you are diabetic, you may be placed on insulin after surgery to have closer control over your blood sugars to promote healing and recovery.  This does not mean that you will be discharged on insulin.  If applicable, your oral antidiabetics will be resumed when you are tolerating a solid diet.  -Your final pathology results from surgery should be available by the Friday after surgery and the results will be relayed to you when available.  -Dr. Antionette CharLisa Stanton is the Surgeon that assists your GYN Oncologist with surgery.  The next day after your surgery you will either see your GYN Oncologist or Dr. Antionette CharLisa Stanton.   Blood Transfusion Information WHAT IS A BLOOD TRANSFUSION? A transfusion is the replacement of blood or some of its parts. Blood is made up of multiple cells which provide different functions.  Red blood cells carry oxygen and are used for blood loss replacement.  White blood cells fight against infection.  Platelets control bleeding.  Plasma helps clot blood.  Other blood products are available for specialized needs, such as hemophilia or other clotting disorders. BEFORE THE TRANSFUSION  Who gives blood for transfusions?   You may be able to donate blood to be used at a later date on yourself (autologous donation).  Relatives can be asked to donate blood. This is generally not any safer than if you have received  blood from a stranger. The same precautions are taken to ensure safety when a relative's blood is donated.  Healthy volunteers who are fully evaluated to make sure their blood is safe. This is blood bank blood. Transfusion therapy is the safest it has ever been in the practice of medicine. Before blood is taken from a donor, a complete history is taken to make sure that person has no history of diseases nor engages in risky social behavior (examples are intravenous drug use or sexual activity with multiple partners). The donor's travel history is screened to minimize risk of transmitting infections, such as malaria. The donated blood is tested for signs of infectious diseases, such as HIV and hepatitis. The blood is then tested to be sure it is compatible with you in order to minimize the chance of a transfusion reaction. If you or a relative donates blood, this is often done in anticipation of surgery and is not appropriate for emergency situations. It takes many days to process the donated blood. RISKS AND COMPLICATIONS Although transfusion therapy is very safe and saves many lives, the main dangers of transfusion include:   Getting an infectious disease.  Developing a transfusion reaction. This is an allergic reaction to something in the blood you were given. Every precaution is taken to prevent this. The decision to have a blood transfusion has been considered carefully by your caregiver before blood is given. Blood is not given unless the benefits outweigh the risks.

## 2017-08-02 LAB — BETA HCG QUANT (REF LAB): hCG Quant: <1 m[IU]/mL

## 2017-08-24 ENCOUNTER — Ambulatory Visit: Payer: Self-pay

## 2017-09-12 NOTE — Patient Instructions (Addendum)
Stephanie Stanton  09/12/2017   Your procedure is scheduled on: 09/19/2017   Report to [facility] Main  Entrance  Report to admitting at    0900 AM    Call this number if you have problems the morning of surgery [phone]   Remember: Do not eat food :After Midnight.  NO SOLID FOOD AFTER MIDNIGHT THE NIGHT PRIOR TO SURGERY. NOTHING BY MOUTH EXCEPT CLEAR LIQUIDS UNTIL 3 HOURS PRIOR TO SCHEULED SURGERY. PLEASE FINISH ENSURE DRINK PER SURGEON ORDER 3 HOURS PRIOR TO SCHEDULED SURGERY TIME WHICH NEEDS TO BE COMPLETED AT _____0800am_______.   Take these medicines the morning of surgery with A SIP OF WATER: none                                 You may not have any metal on your body including hair pins and              piercings  Do not wear jewelry, make-up, lotions, powders or perfumes, deodorant             Do not wear nail polish.  Do not shave  48 hours prior to surgery.     Do not bring valuables to the hospital. [facility] IS NOT             RESPONSIBLE   FOR VALUABLES.  Contacts, dentures or bridgework may not be worn into surgery.  Leave suitcase in the car. After surgery it may be brought to your room.     Patients discharged the day of surgery will not be allowed to drive home.  Name and phone number of your driver:  Special Instructions: coughing and deep breathing exercises, leg exercises               Please read over the following fact sheets you were given: _____________________________________________________________________             [facility] - Preparing for Surgery Before surgery, you can play an important role.  Because skin is not sterile, your skin needs to be as free of germs as possible.  You can reduce the number of germs on your skin by washing with CHG (chlorahexidine gluconate) soap before surgery.  CHG is an antiseptic cleaner which kills germs and bonds with the skin to continue killing germs even after washing. Please DO  NOT use if you have an allergy to CHG or antibacterial soaps.  If your skin becomes reddened/irritated stop using the CHG and inform your nurse when you arrive at Short Stay. Do not shave (including legs and underarms) for at least 48 hours prior to the first CHG shower.  You may shave your face/neck. Please follow these instructions carefully:  1.  Shower with CHG Soap the night before surgery and the  morning of Surgery.  2.  If you choose to wash your hair, wash your hair first as usual with your  normal  shampoo.  3.  After you shampoo, rinse your hair and body thoroughly to remove the  shampoo.                           4.  Use CHG as you would any other liquid soap.  You can apply chg directly  to the skin and wash  Gently with a scrungie or clean washcloth.  5.  Apply the CHG Soap to your body ONLY FROM THE NECK DOWN.   Do not use on face/ open                           Wound or open sores. Avoid contact with eyes, ears mouth and genitals (private parts).                       Wash face,  Genitals (private parts) with your normal soap.             6.  Wash thoroughly, paying special attention to the area where your surgery  will be performed.  7.  Thoroughly rinse your body with warm water from the neck down.  8.  DO NOT shower/wash with your normal soap after using and rinsing off  the CHG Soap.                9.  Pat yourself dry with a clean towel.            10.  Wear clean pajamas.            11.  Place clean sheets on your bed the night of your first shower and do not  sleep with pets. Day of Surgery : Do not apply any lotions/deodorants the morning of surgery.  Please wear clean clothes to the hospital/surgery center.  FAILURE TO FOLLOW THESE INSTRUCTIONS MAY RESULT IN THE CANCELLATION OF YOUR SURGERY PATIENT SIGNATURE_________________________________  NURSE  SIGNATURE__________________________________  ________________________________________________________________________  WHAT IS A BLOOD TRANSFUSION? Blood Transfusion Information  A transfusion is the replacement of blood or some of its parts. Blood is made up of multiple cells which provide different functions.  Red blood cells carry oxygen and are used for blood loss replacement.  White blood cells fight against infection.  Platelets control bleeding.  Plasma helps clot blood.  Other blood products are available for specialized needs, such as hemophilia or other clotting disorders. BEFORE THE TRANSFUSION  Who gives blood for transfusions?   Healthy volunteers who are fully evaluated to make sure their blood is safe. This is blood bank blood. Transfusion therapy is the safest it has ever been in the practice of medicine. Before blood is taken from a donor, a complete history is taken to make sure that person has no history of diseases nor engages in risky social behavior (examples are intravenous drug use or sexual activity with multiple partners). The donor's travel history is screened to minimize risk of transmitting infections, such as malaria. The donated blood is tested for signs of infectious diseases, such as HIV and hepatitis. The blood is then tested to be sure it is compatible with you in order to minimize the chance of a transfusion reaction. If you or a relative donates blood, this is often done in anticipation of surgery and is not appropriate for emergency situations. It takes many days to process the donated blood. RISKS AND COMPLICATIONS Although transfusion therapy is very safe and saves many lives, the main dangers of transfusion include:   Getting an infectious disease.  Developing a transfusion reaction. This is an allergic reaction to something in the blood you were given. Every precaution is taken to prevent this. The decision to have a blood transfusion has been  considered carefully by your caregiver before blood is given. Blood is not given unless the benefits  outweigh the risks. AFTER THE TRANSFUSION  Right after receiving a blood transfusion, you will usually feel much better and more energetic. This is especially true if your red blood cells have gotten low (anemic). The transfusion raises the level of the red blood cells which carry oxygen, and this usually causes an energy increase.  The nurse administering the transfusion will monitor you carefully for complications. HOME CARE INSTRUCTIONS  No special instructions are needed after a transfusion. You may find your energy is better. Speak with your caregiver about any limitations on activity for underlying diseases you may have. SEEK MEDICAL CARE IF:   Your condition is not improving after your transfusion.  You develop redness or irritation at the intravenous (IV) site. SEEK IMMEDIATE MEDICAL CARE IF:  Any of the following symptoms occur over the next 12 hours:  Shaking chills.  You have a temperature by mouth above 102 F (38.9 C), not controlled by medicine.  Chest, back, or muscle pain.  People around you feel you are not acting correctly or are confused.  Shortness of breath or difficulty breathing.  Dizziness and fainting.  You get a rash or develop hives.  You have a decrease in urine output.  Your urine turns a dark color or changes to pink, red, or [place]. Any of the following symptoms occur over the next 10 days:  You have a temperature by mouth above 102 F (38.9 C), not controlled by medicine.  Shortness of breath.  Weakness after normal activity.  The white part of the eye turns yellow (jaundice).  You have a decrease in the amount of urine or are urinating less often.  Your urine turns a dark color or changes to pink, red, or Strength. Document Released: 05/05/2000 Document Revised: 07/31/2011 Document Reviewed: 12/23/2007 ExitCare Patient Information 2014  [place].  _______________________________________________________________________  Incentive Spirometer  An incentive spirometer is a tool that can help keep your lungs clear and active. This tool measures how well you are filling your lungs with each breath. Taking long deep breaths may help reverse or decrease the chance of developing breathing (pulmonary) problems (especially infection) following:  A long period of time when you are unable to move or be active. BEFORE THE PROCEDURE   If the spirometer includes an indicator to show your best effort, your nurse or respiratory therapist will set it to a desired goal.  If possible, sit up straight or lean slightly forward. Try not to slouch.  Hold the incentive spirometer in an upright position. INSTRUCTIONS FOR USE  1. Sit on the edge of your bed if possible, or sit up as far as you can in bed or on a chair. 2. Hold the incentive spirometer in an upright position. 3. Breathe out normally. 4. Place the mouthpiece in your mouth and seal your lips tightly around it. 5. Breathe in slowly and as deeply as possible, raising the piston or the ball toward the top of the column. 6. Hold your breath for 3-5 seconds or for as long as possible. Allow the piston or ball to fall to the bottom of the column. 7. Remove the mouthpiece from your mouth and breathe out normally. 8. Rest for a few seconds and repeat Steps 1 through 7 at least 10 times every 1-2 hours when you are awake. Take your time and take a few normal breaths between deep breaths. 9. The spirometer may include an indicator to show your best effort. Use the indicator as a goal to work  toward during each repetition. 10. After each set of 10 deep breaths, practice coughing to be sure your lungs are clear. If you have an incision (the cut made at the time of surgery), support your incision when coughing by placing a pillow or rolled up towels firmly against it. Once you are able to get  out of bed, walk around indoors and cough well. You may stop using the incentive spirometer when instructed by your caregiver.  RISKS AND COMPLICATIONS  Take your time so you do not get dizzy or light-headed.  If you are in pain, you may need to take or ask for pain medication before doing incentive spirometry. It is harder to take a deep breath if you are having pain. AFTER USE  Rest and breathe slowly and easily.  It can be helpful to keep track of a log of your progress. Your caregiver can provide you with a simple table to help with this. If you are using the spirometer at home, follow these instructions: SEEK MEDICAL CARE IF:   You are having difficultly using the spirometer.  You have trouble using the spirometer as often as instructed.  Your pain medication is not giving enough relief while using the spirometer.  You develop fever of 100.5 F (38.1 C) or higher. SEEK IMMEDIATE MEDICAL CARE IF:   You cough up bloody sputum that had not been present before.  You develop fever of 102 F (38.9 C) or greater.  You develop worsening pain at or near the incision site. MAKE SURE YOU:   Understand these instructions.  Will watch your condition.  Will get help right away if you are not doing well or get worse. Document Released: 09/18/2006 Document Revised: 07/31/2011 Document Reviewed: 11/19/2006 ExitCare Patient Information 2014 [employer], [place].   ________________________________________________________________________    CLEAR LIQUID DIET   Foods Allowed                                                                     Foods Excluded  Coffee and tea, regular and decaf                             liquids that you cannot  Plain Jell-O in any flavor                                             see through such as: Fruit ices (not with fruit pulp)                                     milk, soups, orange juice  Iced Popsicles                                    All  solid food Carbonated beverages, regular and diet  Cranberry, grape and apple juices Sports drinks like Gatorade Lightly seasoned clear broth or consume(fat free) Sugar, honey syrup  Sample Menu Breakfast                                Lunch                                     Supper Cranberry juice                    Beef broth                            Chicken broth Jell-O                                     Grape juice                           Apple juice Coffee or tea                        Jell-O                                      Popsicle                                                Coffee or tea                        Coffee or tea  _____________________________________________________________________

## 2017-09-13 ENCOUNTER — Encounter (HOSPITAL_COMMUNITY): Payer: Self-pay

## 2017-09-13 ENCOUNTER — Encounter (HOSPITAL_COMMUNITY)
Admission: RE | Admit: 2017-09-13 | Discharge: 2017-09-13 | Disposition: A | Discharge status: Home / Self Care | Payer: Self-pay | Source: Ambulatory Visit | Attending: Gynecologic Oncology | Admitting: Gynecologic Oncology

## 2017-09-13 ENCOUNTER — Other Ambulatory Visit: Payer: Self-pay

## 2017-09-13 DIAGNOSIS — N83201 Unspecified ovarian cyst, right side: Secondary | ICD-10-CM | POA: Insufficient documentation

## 2017-09-13 DIAGNOSIS — N939 Abnormal uterine and vaginal bleeding, unspecified: Secondary | ICD-10-CM | POA: Insufficient documentation

## 2017-09-13 DIAGNOSIS — Z01812 Encounter for preprocedural laboratory examination: Secondary | ICD-10-CM | POA: Insufficient documentation

## 2017-09-13 HISTORY — DX: Headache: R51

## 2017-09-13 HISTORY — DX: Nausea with vomiting, unspecified: R11.2

## 2017-09-13 HISTORY — DX: Headache, unspecified: R51.9

## 2017-09-13 HISTORY — DX: Pneumonia, unspecified organism: J18.9

## 2017-09-13 HISTORY — DX: Other specified postprocedural states: Z98.890

## 2017-09-13 HISTORY — DX: Personal history of other medical treatment: Z92.89

## 2017-09-13 LAB — URINALYSIS, ROUTINE W REFLEX MICROSCOPIC
Bacteria, UA: NONE SEEN
Bilirubin Urine: NEGATIVE
Glucose, UA: NEGATIVE mg/dL
KETONES UR: NEGATIVE mg/dL
Leukocytes, UA: NEGATIVE
Nitrite: NEGATIVE
PH: 7 (ref 5.0–8.0)
Protein, ur: NEGATIVE mg/dL
SPECIFIC GRAVITY, URINE: 1.019 (ref 1.005–1.030)

## 2017-09-13 LAB — CBC
HEMATOCRIT: 34.8 % — AB (ref 36.0–46.0)
Hemoglobin: 10.3 g/dL — ABNORMAL LOW (ref 12.0–15.0)
MCH: 24.8 pg — ABNORMAL LOW (ref 26.0–34.0)
MCHC: 29.6 g/dL — ABNORMAL LOW (ref 30.0–36.0)
MCV: 83.7 fL (ref 78.0–100.0)
PLATELETS: 441 10*3/uL — AB (ref 150–400)
RBC: 4.16 MIL/uL (ref 3.87–5.11)
RDW: 17.7 % — AB (ref 11.5–15.5)
WBC: 5.2 10*3/uL (ref 4.0–10.5)

## 2017-09-13 LAB — COMPREHENSIVE METABOLIC PANEL
ALBUMIN: 4 g/dL (ref 3.5–5.0)
ALT: 44 U/L (ref 14–54)
AST: 26 U/L (ref 15–41)
Alkaline Phosphatase: 54 U/L (ref 38–126)
Anion gap: 9 (ref 5–15)
BILIRUBIN TOTAL: 0.2 mg/dL — AB (ref 0.3–1.2)
BUN: 10 mg/dL (ref 6–20)
CO2: 24 mmol/L (ref 22–32)
Calcium: 8.8 mg/dL — ABNORMAL LOW (ref 8.9–10.3)
Chloride: 106 mmol/L (ref 101–111)
Creatinine, Ser: 0.51 mg/dL (ref 0.44–1.00)
GFR calc Af Amer: >60 mL/min (ref >60)
GFR calc non Af Amer: >60 mL/min (ref >60)
Glucose, Bld: 83 mg/dL (ref 65–99)
POTASSIUM: 3.6 mmol/L (ref 3.5–5.1)
Sodium: 139 mmol/L (ref 135–145)
TOTAL PROTEIN: 7 g/dL (ref 6.5–8.1)

## 2017-09-13 LAB — ABO/RH: ABO/RH(D): O POS

## 2017-09-13 LAB — PREGNANCY, URINE: PREG TEST UR: NEGATIVE

## 2017-09-13 NOTE — Progress Notes (Signed)
CBC and U/A done 09/13/2017 faxed via epic to Dr Andrey Farmerossi and Dollene PrimroseMelissa Cross,NP.

## 2017-09-13 NOTE — Progress Notes (Signed)
EKG 05-11-17 epic   

## 2017-09-13 NOTE — Progress Notes (Signed)
Spanissh Interpreter present at time of preop visit.  Patient speaks some [place].

## 2017-09-19 ENCOUNTER — Encounter (HOSPITAL_COMMUNITY): Payer: Self-pay | Admitting: *Deleted

## 2017-09-19 ENCOUNTER — Ambulatory Visit (HOSPITAL_COMMUNITY): Payer: Self-pay | Admitting: Certified Registered"

## 2017-09-19 ENCOUNTER — Ambulatory Visit (HOSPITAL_COMMUNITY)
Admission: RE | Admit: 2017-09-19 | Discharge: 2017-09-20 | Disposition: A | Discharge status: Home / Self Care | Payer: Self-pay | Source: Ambulatory Visit | Attending: Gynecologic Oncology | Admitting: Gynecologic Oncology

## 2017-09-19 ENCOUNTER — Encounter (HOSPITAL_COMMUNITY)
Admission: RE | Disposition: A | Discharge status: Home / Self Care | Payer: Self-pay | Source: Ambulatory Visit | Attending: Gynecologic Oncology

## 2017-09-19 ENCOUNTER — Other Ambulatory Visit: Payer: Self-pay

## 2017-09-19 DIAGNOSIS — N888 Other specified noninflammatory disorders of cervix uteri: Secondary | ICD-10-CM | POA: Insufficient documentation

## 2017-09-19 DIAGNOSIS — N920 Excessive and frequent menstruation with regular cycle: Secondary | ICD-10-CM | POA: Diagnosis present

## 2017-09-19 DIAGNOSIS — N8301 Follicular cyst of right ovary: Secondary | ICD-10-CM | POA: Insufficient documentation

## 2017-09-19 DIAGNOSIS — F329 Major depressive disorder, single episode, unspecified: Secondary | ICD-10-CM | POA: Insufficient documentation

## 2017-09-19 DIAGNOSIS — N83201 Unspecified ovarian cyst, right side: Secondary | ICD-10-CM | POA: Diagnosis present

## 2017-09-19 DIAGNOSIS — N800 Endometriosis of uterus: Secondary | ICD-10-CM | POA: Insufficient documentation

## 2017-09-19 DIAGNOSIS — D509 Iron deficiency anemia, unspecified: Secondary | ICD-10-CM | POA: Diagnosis present

## 2017-09-19 DIAGNOSIS — N939 Abnormal uterine and vaginal bleeding, unspecified: Secondary | ICD-10-CM | POA: Insufficient documentation

## 2017-09-19 DIAGNOSIS — Z87891 Personal history of nicotine dependence: Secondary | ICD-10-CM | POA: Insufficient documentation

## 2017-09-19 DIAGNOSIS — N83209 Unspecified ovarian cyst, unspecified side: Secondary | ICD-10-CM | POA: Diagnosis present

## 2017-09-19 DIAGNOSIS — Z886 Allergy status to analgesic agent status: Secondary | ICD-10-CM | POA: Insufficient documentation

## 2017-09-19 DIAGNOSIS — J45909 Unspecified asthma, uncomplicated: Secondary | ICD-10-CM | POA: Insufficient documentation

## 2017-09-19 HISTORY — PX: ROBOTIC ASSISTED TOTAL HYSTERECTOMY WITH BILATERAL SALPINGO OOPHERECTOMY: SHX6086

## 2017-09-19 LAB — TYPE AND SCREEN
ABO/RH(D): O POS
Antibody Screen: NEGATIVE

## 2017-09-19 SURGERY — HYSTERECTOMY, TOTAL, ROBOT-ASSISTED, LAPAROSCOPIC, WITH BILATERAL SALPINGO-OOPHORECTOMY
Anesthesia: General

## 2017-09-19 MED ORDER — DEXAMETHASONE SODIUM PHOSPHATE 4 MG/ML IJ SOLN: 4.0000 mg | INTRAMUSCULAR | Status: DC

## 2017-09-19 MED ORDER — LIDOCAINE 2% (20 MG/ML) 5 ML SYRINGE
INTRAMUSCULAR | Status: DC | PRN
  Administered 2017-09-19: 50 mg via INTRAVENOUS

## 2017-09-19 MED ORDER — PROMETHAZINE HCL 25 MG/ML IJ SOLN: 6.2500 mg | INTRAMUSCULAR | Status: DC | PRN

## 2017-09-19 MED ORDER — SODIUM CHLORIDE 0.9 % IV SOLN: 4.0000 mg | Freq: Four times a day (QID) | INTRAVENOUS | Status: DC | PRN

## 2017-09-19 MED ORDER — SODIUM CHLORIDE 0.9 % IV SOLN
INTRAVENOUS | Status: DC
  Administered 2017-09-19: 18:00:00 via INTRAVENOUS

## 2017-09-19 MED ORDER — FENTANYL CITRATE (PF) 250 MCG/5ML IJ SOLN
INTRAMUSCULAR | Status: AC
  Filled 2017-09-19: qty 5

## 2017-09-19 MED ORDER — ONDANSETRON HCL 4 MG/2ML IJ SOLN: 4.0000 mg | Freq: Four times a day (QID) | INTRAMUSCULAR | Status: DC | PRN

## 2017-09-19 MED ORDER — PROPOFOL 10 MG/ML IV BOLUS
INTRAVENOUS | Status: DC | PRN
  Administered 2017-09-19: 170 mg via INTRAVENOUS

## 2017-09-19 MED ORDER — SUGAMMADEX SODIUM 200 MG/2ML IV SOLN
INTRAVENOUS | Status: DC | PRN
  Administered 2017-09-19: 270.4 mg via INTRAVENOUS

## 2017-09-19 MED ORDER — DEXAMETHASONE SODIUM PHOSPHATE 10 MG/ML IJ SOLN
INTRAMUSCULAR | Status: AC
  Filled 2017-09-19: qty 1

## 2017-09-19 MED ORDER — ROCURONIUM BROMIDE 100 MG/10ML IV SOLN
INTRAVENOUS | Status: DC | PRN
  Administered 2017-09-19: 60 mg via INTRAVENOUS
  Administered 2017-09-19: 20 mg via INTRAVENOUS

## 2017-09-19 MED ORDER — ONDANSETRON HCL 4 MG PO TABS: 4.0000 mg | ORAL_TABLET | Freq: Four times a day (QID) | ORAL | Status: DC | PRN

## 2017-09-19 MED ORDER — ACETAMINOPHEN 500 MG PO TABS
1000.0000 mg | ORAL_TABLET | Freq: Four times a day (QID) | ORAL | Status: AC
  Administered 2017-09-19 – 2017-09-20 (×3): 1000 mg via ORAL
  Filled 2017-09-19 (×5): qty 2

## 2017-09-19 MED ORDER — SCOPOLAMINE 1 MG/3DAYS TD PT72
1.0000 | MEDICATED_PATCH | TRANSDERMAL | Status: DC
  Administered 2017-09-19: 1.5 mg via TRANSDERMAL
  Filled 2017-09-19: qty 1

## 2017-09-19 MED ORDER — KETOROLAC TROMETHAMINE 30 MG/ML IJ SOLN
INTRAMUSCULAR | Status: DC | PRN
  Administered 2017-09-19: 30 mg via INTRAVENOUS

## 2017-09-19 MED ORDER — LIDOCAINE 2% (20 MG/ML) 5 ML SYRINGE
INTRAMUSCULAR | Status: AC
  Filled 2017-09-19: qty 5

## 2017-09-19 MED ORDER — FENTANYL CITRATE (PF) 100 MCG/2ML IJ SOLN: 25.0000 ug | INTRAMUSCULAR | Status: DC | PRN

## 2017-09-19 MED ORDER — ROCURONIUM BROMIDE 10 MG/ML (PF) SYRINGE
PREFILLED_SYRINGE | INTRAVENOUS | Status: AC
  Filled 2017-09-19: qty 5

## 2017-09-19 MED ORDER — MIDAZOLAM HCL 2 MG/2ML IJ SOLN
INTRAMUSCULAR | Status: AC
  Filled 2017-09-19: qty 2

## 2017-09-19 MED ORDER — HYDROMORPHONE HCL 1 MG/ML IJ SOLN
0.5000 mg | INTRAMUSCULAR | Status: DC | PRN
  Administered 2017-09-19: 0.5 mg via INTRAVENOUS
  Filled 2017-09-19: qty 0.5

## 2017-09-19 MED ORDER — FENTANYL CITRATE (PF) 100 MCG/2ML IJ SOLN
INTRAMUSCULAR | Status: DC | PRN
  Administered 2017-09-19 (×3): 50 ug via INTRAVENOUS

## 2017-09-19 MED ORDER — TRAMADOL HCL 50 MG PO TABS
50.0000 mg | ORAL_TABLET | Freq: Four times a day (QID) | ORAL | Status: DC | PRN
  Administered 2017-09-19 – 2017-09-20 (×2): 50 mg via ORAL
  Filled 2017-09-19 (×2): qty 1

## 2017-09-19 MED ORDER — LIDOCAINE 2% (20 MG/ML) 5 ML SYRINGE: INTRAMUSCULAR | Status: DC | PRN

## 2017-09-19 MED ORDER — PROPOFOL 10 MG/ML IV BOLUS
INTRAVENOUS | Status: AC
  Filled 2017-09-19: qty 20

## 2017-09-19 MED ORDER — ACETAMINOPHEN 500 MG PO TABS
1000.0000 mg | ORAL_TABLET | ORAL | Status: AC
  Administered 2017-09-19: 1000 mg via ORAL
  Filled 2017-09-19: qty 2

## 2017-09-19 MED ORDER — SUGAMMADEX SODIUM 200 MG/2ML IV SOLN
INTRAVENOUS | Status: AC
  Filled 2017-09-19: qty 2

## 2017-09-19 MED ORDER — LACTATED RINGERS IV SOLN
INTRAVENOUS | Status: DC
  Administered 2017-09-19 (×3): via INTRAVENOUS

## 2017-09-19 MED ORDER — ONDANSETRON HCL 4 MG/2ML IJ SOLN
INTRAMUSCULAR | Status: AC
  Filled 2017-09-19: qty 4

## 2017-09-19 MED ORDER — DEXAMETHASONE SODIUM PHOSPHATE 10 MG/ML IJ SOLN
INTRAMUSCULAR | Status: DC | PRN
  Administered 2017-09-19: 10 mg via INTRAVENOUS

## 2017-09-19 MED ORDER — PROPOFOL 10 MG/ML IV BOLUS: INTRAVENOUS | Status: DC | PRN

## 2017-09-19 MED ORDER — KETOROLAC TROMETHAMINE 30 MG/ML IJ SOLN
INTRAMUSCULAR | Status: AC
  Filled 2017-09-19: qty 1

## 2017-09-19 MED ORDER — OXYCODONE-ACETAMINOPHEN 5-325 MG PO TABS
1.0000 | ORAL_TABLET | ORAL | Status: DC | PRN
  Administered 2017-09-19: 1 via ORAL
  Filled 2017-09-19 (×3): qty 1

## 2017-09-19 MED ORDER — STERILE WATER FOR IRRIGATION IR SOLN
Status: DC | PRN
  Administered 2017-09-19: 3000 mL

## 2017-09-19 MED ORDER — ONDANSETRON HCL 4 MG/2ML IJ SOLN
INTRAMUSCULAR | Status: DC | PRN
  Administered 2017-09-19 (×2): 4 mg via INTRAVENOUS

## 2017-09-19 MED ORDER — GABAPENTIN 300 MG PO CAPS
300.0000 mg | ORAL_CAPSULE | ORAL | Status: AC
  Administered 2017-09-19: 300 mg via ORAL
  Filled 2017-09-19: qty 1

## 2017-09-19 MED ORDER — GABAPENTIN 300 MG PO CAPS
300.0000 mg | ORAL_CAPSULE | Freq: Two times a day (BID) | ORAL | Status: DC
  Administered 2017-09-19 – 2017-09-20 (×2): 300 mg via ORAL
  Filled 2017-09-19 (×2): qty 1

## 2017-09-19 MED ORDER — MIDAZOLAM HCL 5 MG/5ML IJ SOLN
INTRAMUSCULAR | Status: DC | PRN
  Administered 2017-09-19: 2 mg via INTRAVENOUS

## 2017-09-19 MED ORDER — CEFAZOLIN SODIUM-DEXTROSE 2-4 GM/100ML-% IV SOLN
2.0000 g | INTRAVENOUS | Status: AC
  Administered 2017-09-19: 2 g via INTRAVENOUS
  Filled 2017-09-19: qty 100

## 2017-09-19 SURGICAL SUPPLY — 49 items
ADH SKN CLS APL DERMABOND .7 (GAUZE/BANDAGES/DRESSINGS) ×1
AGENT HMST KT MTR STRL THRMB (HEMOSTASIS)
APL ESCP 34 STRL LF DISP (HEMOSTASIS)
APPLICATOR SURGIFLO ENDO (HEMOSTASIS) IMPLANT
BAG LAPAROSCOPIC 12 15 PORT 16 (BASKET) IMPLANT
BAG RETRIEVAL 12/15 (BASKET)
BAG SPEC RTRVL LRG 6X4 10 (ENDOMECHANICALS)
COVER BACK TABLE 60X90IN (DRAPES) ×2 IMPLANT
COVER TIP SHEARS 8 DVNC (MISCELLANEOUS) ×1 IMPLANT
COVER TIP SHEARS 8MM DA VINCI (MISCELLANEOUS) ×1
DERMABOND ADVANCED (GAUZE/BANDAGES/DRESSINGS) ×1
DERMABOND ADVANCED .7 DNX12 (GAUZE/BANDAGES/DRESSINGS) ×1 IMPLANT
DRAPE ARM DVNC X/XI (DISPOSABLE) ×4 IMPLANT
DRAPE COLUMN DVNC XI (DISPOSABLE) ×1 IMPLANT
DRAPE DA VINCI XI ARM (DISPOSABLE) ×4
DRAPE DA VINCI XI COLUMN (DISPOSABLE) ×1
DRAPE SHEET LG 3/4 BI-LAMINATE (DRAPES) ×2 IMPLANT
DRAPE SURG IRRIG POUCH 19X23 (DRAPES) ×2 IMPLANT
ELECT REM PT RETURN 15FT ADLT (MISCELLANEOUS) ×2 IMPLANT
GLOVE BIO SURGEON STRL SZ 6 (GLOVE) ×8 IMPLANT
GLOVE BIO SURGEON STRL SZ 6.5 (GLOVE) ×4 IMPLANT
GOWN STRL REUS W/ TWL LRG LVL3 (GOWN DISPOSABLE) ×2 IMPLANT
GOWN STRL REUS W/TWL LRG LVL3 (GOWN DISPOSABLE) ×4
HOLDER FOLEY CATH W/STRAP (MISCELLANEOUS) ×2 IMPLANT
IRRIG SUCT STRYKERFLOW 2 WTIP (MISCELLANEOUS) ×2
IRRIGATION SUCT STRKRFLW 2 WTP (MISCELLANEOUS) ×1 IMPLANT
KIT PROCEDURE DA VINCI SI (MISCELLANEOUS)
KIT PROCEDURE DVNC SI (MISCELLANEOUS) IMPLANT
MANIPULATOR UTERINE 4.5 ZUMI (MISCELLANEOUS) ×2 IMPLANT
NDL SPNL 18GX3.5 QUINCKE PK (NEEDLE) ×1 IMPLANT
NEEDLE SPNL 18GX3.5 QUINCKE PK (NEEDLE) ×2 IMPLANT
OBTURATOR OPTICAL STANDARD 8MM (TROCAR) ×1
OBTURATOR OPTICAL STND 8 DVNC (TROCAR) ×1
OBTURATOR OPTICALSTD 8 DVNC (TROCAR) ×1 IMPLANT
PACK ROBOT GYN CUSTOM WL (TRAY / TRAY PROCEDURE) ×2 IMPLANT
PAD POSITIONING PINK XL (MISCELLANEOUS) ×2 IMPLANT
POUCH SPECIMEN RETRIEVAL 10MM (ENDOMECHANICALS) IMPLANT
SEAL CANN UNIV 5-8 DVNC XI (MISCELLANEOUS) ×4 IMPLANT
SEAL XI 5MM-8MM UNIVERSAL (MISCELLANEOUS) ×4
SET TRI-LUMEN FLTR TB AIRSEAL (TUBING) ×2 IMPLANT
SURGIFLO W/THROMBIN 8M KIT (HEMOSTASIS) IMPLANT
SUT VIC AB 0 CT1 27 (SUTURE)
SUT VIC AB 0 CT1 27XBRD ANTBC (SUTURE) IMPLANT
SYR 10ML LL (SYRINGE) ×2 IMPLANT
TOWEL OR NON WOVEN STRL DISP B (DISPOSABLE) ×2 IMPLANT
TRAP SPECIMEN MUCOUS 40CC (MISCELLANEOUS) ×1 IMPLANT
TRAY FOLEY W/METER SILVER 16FR (SET/KITS/TRAYS/PACK) ×2 IMPLANT
UNDERPAD 30X30 (UNDERPADS AND DIAPERS) ×2 IMPLANT
WATER STERILE IRR 1000ML POUR (IV SOLUTION) ×3 IMPLANT

## 2017-09-19 NOTE — Op Note (Addendum)
OPERATIVE NOTE 09/19/17  Surgeon: Quinn Axe   Assistants: Dr Antionette Char (an MD assistant was necessary for tissue manipulation, management of robotic instrumentation, retraction and positioning due to the complexity of the case and hospital policies).   Anesthesia: General endotracheal anesthesia  ASA Class: 3   Pre-operative Diagnosis: adnexal mass , abnormal uterine bleeding  Post-operative Diagnosis: same, and benign right ovarian mass  Operation: Robotic-assisted laparoscopic total hysterectomy with bilateral salpingectomy, right oophorectomy  Surgeon: Quinn Axe  Assistant Surgeon: Tawnya Crook MD  Anesthesia: GET  Urine Output: 300  Operative Findings:  : 10cm globular uterus, grossly normal external appearance. Right ovary with 4cm cyst (benign on frozen section), normal left tube and ovary.  Estimated Blood Loss:  less than 50 mL      Total IV Fluids: 800 ml         Specimens: washings, uterus cervix right ovary, bilateral tubes         Complications:  None; patient tolerated the procedure well.         Disposition: PACU - hemodynamically stable.  Procedure Details  The patient was seen in the Holding Room. The risks, benefits, complications, treatment options, and expected outcomes were discussed with the patient.  The patient concurred with the proposed plan, giving informed consent.  The site of surgery properly noted/marked. The patient was identified as Hydrologist and the procedure verified as a Robotic-assisted hysterectomy with bilateral salpingo oophorectomy. A Time Out was held and the above information confirmed.  After induction of anesthesia, the patient was draped and prepped in the usual sterile manner. Pt was placed in supine position after anesthesia and draped and prepped in the usual sterile manner. The abdominal drape was placed after the CholoraPrep had been allowed to dry for 3 minutes.  Her arms were tucked  to her side with all appropriate precautions.  The shoulders were stabilized with padded shoulder blocks applied to the acromium processes.  The patient was placed in the semi-lithotomy position in [place] stirrups.  The perineum was prepped with Betadine. The patient was then prepped. Foley catheter was placed.  A sterile speculum was placed in the vagina.  The cervix was grasped with a single-tooth tenaculum and dilated with Shawnie Pons dilators.  The ZUMI uterine manipulator with a medium colpotomizer ring was placed without difficulty.  A pneum occluder balloon was placed over the manipulator.  OG tube placement was confirmed and to suction.   Next, a 5 mm skin incision was made 1 cm below the subcostal margin in the midclavicular line.  The 5 mm Optiview port and scope was used for direct entry.  Opening pressure was under 10 mm CO2.  The abdomen was insufflated and the findings were noted as above.   At this point and all points during the procedure, the patient's intra-abdominal pressure did not exceed 15 mmHg. Next, a 10 mm skin incision was made in the umbilicus and a right and left port was placed about 10 cm lateral to the robot port on the right and left side.  All ports were placed under direct visualization.  The patient was placed in steep Trendelenburg.  Bowel was folded away into the upper abdomen.  The robot was docked in the normal manner.  The hysterectomy was started after the round ligament on the right side was incised and the retroperitoneum was entered and the pararectal space was developed.  The ureter was noted to be on the medial leaf of the broad  ligament.  The peritoneum above the ureter was incised and stretched and the infundibulopelvic ligament was skeletonized, cauterized and cut.  The posterior peritoneum was taken down to the level of the KOH ring.  The anterior peritoneum was also taken down.  The bladder flap was created to the level of the KOH ring.  The uterine artery on the right  side was skeletonized, cauterized and cut in the normal manner.  A similar procedure was performed on the left, however, the left ovary was spared by separating hte fallopian tube from the ovary with bipolar and cutting, and making a retroperitoneal window above the ureter across which the utero-ovarian ligament was cut.  The colpotomy was made and the uterus, cervix, bilateral tubes and right ovary were amputated and delivered through the vagina.  Pedicles were inspected and excellent hemostasis was achieved.    Frozen section was benign.  The colpotomy at the vaginal cuff was closed with Vicryl on a CT1 needle in a running manner.  Irrigation was used and excellent hemostasis was achieved.  At this point in the procedure was completed.  Robotic instruments were removed under direct visulaization.  The robot was undocked. The 10 mm ports were closed with Vicryl on a UR-5 needle and the fascia was closed with 0 Vicryl on a UR-5 needle.  The skin was closed with 4-0 Vicryl in a subcuticular manner.  Dermabond was applied.  Sponge, lap and needle counts correct x 2.  The patient was taken to the recovery room in stable condition.  The vagina was swabbed with  minimal bleeding noted.   All instrument and needle counts were correct x  3.   The patient was transferred to the recovery room in a stable condition.  Quinn Axe, MD

## 2017-09-19 NOTE — Transfer of Care (Signed)
Immediate Anesthesia Transfer of Care Note  Patient: Stephanie Stanton  Procedure(s) Performed: XI ROBOTIC ASSISTED TOTAL HYSTERECTOMY WITH BILATERAL SALPINGECTOMY AND RIGHT OOPHORECTOMY (N/A )  Patient Location: PACU  Anesthesia Type:General  Level of Consciousness: awake and alert   Airway & Oxygen Therapy: Patient Spontanous Breathing and Patient connected to nasal cannula oxygen  Post-op Assessment: Report given to RN and Post -op Vital signs reviewed and stable  Post vital signs: Reviewed and stable  Last Vitals:  Vitals Value Taken Time  BP 105/66 09/19/2017  1:09 PM  Temp    Pulse 58 09/19/2017  1:11 PM  Resp 15 09/19/2017  1:11 PM  SpO2 91 % 09/19/2017  1:11 PM  Vitals shown include unvalidated device data.  Last Pain:  Vitals:   09/19/17 0914  TempSrc: Oral      Patients Stated Pain Goal: 4 (09/19/17 1610)  Complications: No apparent anesthesia complications

## 2017-09-19 NOTE — Anesthesia Preprocedure Evaluation (Signed)
Anesthesia Evaluation  Patient identified by MRN, date of birth, ID band Patient awake    Reviewed: Allergy & Precautions, NPO status , Patient's Chart, lab work & pertinent test results  History of Anesthesia Complications (+) PONV  Airway Mallampati: II  TM Distance: >3 FB Neck ROM: Full    Dental no notable dental hx. (+) Dental Advisory Given   Pulmonary former smoker,    Pulmonary exam normal        Cardiovascular negative cardio ROS Normal cardiovascular exam     Neuro/Psych PSYCHIATRIC DISORDERS Depression negative neurological ROS     GI/Hepatic negative GI ROS, Neg liver ROS,   Endo/Other  negative endocrine ROS  Renal/GU negative Renal ROS     Musculoskeletal   Abdominal   Peds  Hematology negative hematology ROS (+)   Anesthesia Other Findings   Reproductive/Obstetrics                             Anesthesia Physical Anesthesia Plan  ASA: II  Anesthesia Plan: General   Post-op Pain Management:    Induction: Intravenous  PONV Risk Score and Plan: 4 or greater and Ondansetron, Dexamethasone, Scopolamine patch - Pre-op and Diphenhydramine  Airway Management Planned: Oral ETT  Additional Equipment:   Intra-op Plan:   Post-operative Plan: Extubation in OR  Informed Consent: I have reviewed the patients History and Physical, chart, labs and discussed the procedure including the risks, benefits and alternatives for the proposed anesthesia with the patient or authorized representative who has indicated his/her understanding and acceptance.   Dental advisory given  Plan Discussed with: Anesthesiologist  Anesthesia Plan Comments:         Anesthesia Quick Evaluation

## 2017-09-19 NOTE — Anesthesia Postprocedure Evaluation (Signed)
Anesthesia Post Note  Patient: Stephanie Stanton  Procedure(s) Performed: XI ROBOTIC ASSISTED TOTAL HYSTERECTOMY WITH BILATERAL SALPINGECTOMY AND RIGHT OOPHORECTOMY (N/A )     Patient location during evaluation: PACU Anesthesia Type: General Level of consciousness: sedated Pain management: pain level controlled Vital Signs Assessment: post-procedure vital signs reviewed and stable Respiratory status: spontaneous breathing and respiratory function stable Cardiovascular status: stable Postop Assessment: no apparent nausea or vomiting Anesthetic complications: no    Last Vitals:  Vitals:   09/19/17 1345 09/19/17 1400  BP: (!) 100/59 (!) 89/58  Pulse: (!) 48 (!) 50  Resp: 13 15  Temp:  36.6 C  SpO2: 100% 100%    Last Pain:  Vitals:   09/19/17 1400  TempSrc:   PainSc: Asleep                 [name] DANIEL

## 2017-09-19 NOTE — H&P (Signed)
Consult Note: Gyn-Onc  Consult was requested by Dr. Marice Potter for the evaluation of Kamarii Perez-Velazquez 42 y.o. female  CC:  No chief complaint on file.   Assessment/Plan:  Ms. Ivanell Deshotel  is a 43 y.o.  year old with an enlarged uterus (11cm) abnormal uterine bleeding, and a 6cm right ovarian cyst. Her CA 125 is elevated to 86.  I discussed with Alcario Drought that I had a low suspicion that she has a malignancy however I do believe it is appropriate for [place] to proceed with hysterectomy and unilateral salpingo-oophorectomy given her symptomatic abnormal uterine bleeding.  We will perform frozen section of her ovarian mass at the time of surgery and if malignancy was identified would perform a left salpingo-oophorectomy and staging.  Given her symptoms of abnormal bleeding since a D&C for miscarriage we will check an hCG today to make sure there is no occult stational trophoblastic neoplasia.  We will also check a CBC to ensure that she is not severely anemic in light of her recent blood transfusion for menorrhagia.  I discussed hormonal options such as a Mirena IUD or changing the oral progestin dose.  She is not interested in medical therapy.  I discussed surgical intervention and its risks including  bleeding, infection, damage to internal organs (such as bladder,ureters, bowels), blood clot, reoperation and rehospitalization.  She is interested in proceeding with hysterectomy unilateral salpingo-oophorectomy in the last week of April. She understands that hysterectomy will result in permanent infertility.   HPI: Ms Allsion Nogales is a 42 year old who is seen in consultation at the request of Dr Marice Potter for abnormal uterine bleeding, a right ovarian cyst and elevated CA 125.  Patient reports abnormal uterine bleeding following a miscarriage D&C approximate 5 years ago.  The bleeding was cyclic until December 2018 when she began bleeding every day quite heavily.  She was seen at the MAU at  [facility] on May 07, 2017 and was transfused 2 units of packed red blood cells.  An ultrasound scan was performed at that time which showed a 6 cm slightly complex right ovarian cyst but no uterine pathology was noted.  She was given Megace which did decrease the volume of flow but she continues to still have daily vaginal bleeding.  An endometrial biopsy performed by Dr. Marice Potter on June 08, 2017 revealed benign endometrium with hormone effect but no hyperplasia or malignancy.  The ultrasound from May 15, 2017 revealed a uterus measuring 11.2 x 7.3 x 7.4 cm with a 12 mm endometrial thickness.  The right ovary measured 6 x 4.7 x 5.6 cm with a minimally complex septated cyst measuring 5.2 cm.  The left ovary was grossly normal.  The impression of the cyst was that it was likely benign.  Ca1 25 was drawn on June 25, 2017 and was slightly elevated at 85.6.    The patient has no significant family history of malignancy.  She has a history of 5 prior vaginal deliveries.  She has had only a cholecystectomy as a abdominal surgery.  She is a remote history of an IUD use.  Current Meds:  Outpatient Encounter Medications as of 08/01/2017  Medication Sig  . ferrous sulfate 325 (65 FE) MG tablet Take 1 tablet (325 mg total) by mouth 3 (three) times daily with meals. (Patient taking differently: Take 325 mg by mouth 2 (two) times daily with a meal. )  . Multiple Vitamin (MULTIVITAMIN) tablet Take 1 tablet by mouth daily.  . [DISCONTINUED] ibuprofen (  ADVIL,MOTRIN) 200 MG tablet Take 400 mg by mouth every 6 (six) hours as needed for moderate pain.  . [DISCONTINUED] megestrol (MEGACE) 40 MG tablet Take 2 tablets (80 mg total) by mouth 2 (two) times daily.  . [DISCONTINUED] Vitamin D, Ergocalciferol, (DRISDOL) 50000 units CAPS capsule Take 1 capsule (50,000 Units total) by mouth every 7 (seven) days. (Patient not taking: Reported on 07/17/2017)   No facility-administered encounter medications on file  as of 08/01/2017.     Allergy:  Allergies  Allergen Reactions  . Aspirin Anaphylaxis    Social Hx:   Social History   Socioeconomic History  . Marital status: Married    Spouse name: Not on file  . Number of children: Not on file  . Years of education: Not on file  . Highest education level: Not on file  Occupational History  . Not on file  Social Needs  . Financial resource strain: Not on file  . Food insecurity:    Worry: Not on file    Inability: Not on file  . Transportation needs:    Medical: Not on file    Non-medical: Not on file  Tobacco Use  . Smoking status: Former Games developer  . Smokeless tobacco: Never Used  Substance and Sexual Activity  . Alcohol use: Yes    Comment: rarely   . Drug use: No  . Sexual activity: Never    Partners: Male    Birth control/protection: None  Lifestyle  . Physical activity:    Days per week: Not on file    Minutes per session: Not on file  . Stress: Not on file  Relationships  . Social connections:    Talks on phone: Not on file    Gets together: Not on file    Attends religious service: Not on file    Active member of club or organization: Not on file    Attends meetings of clubs or organizations: Not on file    Relationship status: Not on file  . Intimate partner violence:    Fear of current or ex partner: Not on file    Emotionally abused: Not on file    Physically abused: Not on file    Forced sexual activity: Not on file  Other Topics Concern  . Not on file  Social History Narrative  . Not on file    Past Surgical Hx:  Past Surgical History:  Procedure Laterality Date  . CHOLECYSTECTOMY    . CT ABD W & PELVIS WO CM  08/2010   IUD in place, constipation  . NASAL SEPTUM SURGERY     deviated septum  . WISDOM TOOTH EXTRACTION      Past Medical Hx:  Past Medical History:  Diagnosis Date  . Anemia   . Asthma   . Blood transfusion without reported diagnosis   . Headache    hx of rare migraine   . Hx of  transfusion of packed red blood cells 04/2017  . Pneumonia    hx of   . PONV (postoperative nausea and vomiting)     Past Gynecological History:   No LMP recorded.  Family Hx:  Family History  Problem Relation Age of Onset  . Cancer Cousin        breast  . Diabetes Mother   . Hypertension Mother     Review of Systems:  Constitutional  Feels well,    ENT Normal appearing ears and nares bilaterally Skin/Breast  No rash, sores, jaundice, itching,  dryness Cardiovascular  No chest pain, shortness of breath, or edema  Pulmonary  No cough or wheeze.  Gastro Intestinal  No nausea, vomitting, or diarrhoea. No bright red blood per rectum, no abdominal pain, change in bowel movement, or constipation.  Genito Urinary  No frequency, urgency, dysuria, + bleeding Musculo Skeletal  No myalgia, arthralgia, joint swelling or pain  Neurologic  No weakness, numbness, change in gait,  Psychology  No depression, anxiety, insomnia.   Vitals:  Blood pressure (!) 120/53, pulse 72, temperature 97.8 F (36.6 C), temperature source Oral, resp. rate 16, height dMg$  (1.6 m), weight 149 lb (67.6 kg), SpO2 100 %.  Physical Exam: WD in NAD Neck  Supple NROM, without any enlargements.  Lymph Node Survey No cervical supraclavicular or inguinal adenopathy Cardiovascular  Pulse normal rate, regularity and rhythm. S1 and S2 normal.  Lungs  Clear to auscultation bilateraly, without wheezes/crackles/rhonchi. Good air movement.  Skin  No rash/lesions/breakdown  Psychiatry  Alert and oriented to person, place, and time  Abdomen  Normoactive bowel sounds, abdomen soft, non-tender and thin, no palpable masses without evidence of hernia.  Back No CVA tenderness Genito Urinary  Vulva/vagina: Normal external female genitalia.   No lesions. No discharge or bleeding.  Bladder/urethra:  No lesions or masses, well supported bladder  Vagina: normal  Cervix: Normal appearing, no lesions.  Uterus:   Slightly bulky, retroverted, mobile, no parametrial involvement or nodularity.  Adnexa: fullness in right adnexa.  Rectal  Good tone, no masses no cul de sac nodularity.  Extremities  No bilateral cyanosis, clubbing or edema.   Luisa Dago, MD  09/19/2017, 11:10 AM

## 2017-09-19 NOTE — Anesthesia Procedure Notes (Signed)
Procedure Name: Intubation Date/Time: 09/19/2017 11:32 AM Performed by: Marny Lowenstein, CRNA Pre-anesthesia Checklist: Patient identified, Emergency Drugs available, Suction available, Patient being monitored and Timeout performed Patient Re-evaluated:Patient Re-evaluated prior to induction Oxygen Delivery Method: Circle system utilized Preoxygenation: Pre-oxygenation with 100% oxygen Induction Type: IV induction Ventilation: Mask ventilation without difficulty Laryngoscope Size: Miller and 2 Grade View: Grade III Tube size: 7.0 mm Number of attempts: 1 Airway Equipment and Method: Patient positioned with wedge pillow Placement Confirmation: ETT inserted through vocal cords under direct vision,  positive ETCO2,  CO2 detector and breath sounds checked- equal and bilateral Secured at: 21 cm Tube secured with: Tape Dental Injury: Teeth and Oropharynx as per pre-operative assessment

## 2017-09-20 LAB — BASIC METABOLIC PANEL
Anion gap: 8 (ref 5–15)
BUN: 5 mg/dL — AB (ref 6–20)
CALCIUM: 8.2 mg/dL — AB (ref 8.9–10.3)
CO2: 23 mmol/L (ref 22–32)
CREATININE: 0.4 mg/dL — AB (ref 0.44–1.00)
Chloride: 108 mmol/L (ref 101–111)
Glucose, Bld: 108 mg/dL — ABNORMAL HIGH (ref 65–99)
Potassium: 3.9 mmol/L (ref 3.5–5.1)
SODIUM: 139 mmol/L (ref 135–145)

## 2017-09-20 LAB — CBC
HCT: 28.4 % — ABNORMAL LOW (ref 36.0–46.0)
Hemoglobin: 8.8 g/dL — ABNORMAL LOW (ref 12.0–15.0)
MCH: 26.3 pg (ref 26.0–34.0)
MCHC: 31 g/dL (ref 30.0–36.0)
MCV: 85 fL (ref 78.0–100.0)
PLATELETS: 313 10*3/uL (ref 150–400)
RBC: 3.34 MIL/uL — AB (ref 3.87–5.11)
RDW: 19.3 % — AB (ref 11.5–15.5)
WBC: 10.1 10*3/uL (ref 4.0–10.5)

## 2017-09-20 MED ORDER — OXYCODONE HCL 5 MG PO TABS: 5.0000 mg | ORAL_TABLET | ORAL | 0 refills | Status: DC | PRN

## 2017-09-20 NOTE — Progress Notes (Signed)
Discharge instructions given to pt.  Questions answered, and pt demonstrated understanding.  Daughter at bedside to escort pt home.

## 2017-09-20 NOTE — Discharge Summary (Addendum)
Physician Discharge Summary  Patient ID: Stephanie Stanton MRN: [id number] DOB/AGE: [birth year] 42 y.o.  Admit date: 09/19/2017 Discharge date: 09/20/2017  Admission Diagnoses: Right ovarian cyst  Discharge Diagnoses:  Principal Problem:   Right ovarian cyst Active Problems:   ANEMIA, IRON DEFICIENCY   Menorrhagia with regular cycle   Ovarian cyst   Discharged Condition:  The patient is in good condition and stable for discharge.   Hospital Course: On 09/19/2017, the patient underwent the following: Procedure(s): XI ROBOTIC ASSISTED TOTAL HYSTERECTOMY WITH BILATERAL SALPINGECTOMY AND RIGHT OOPHORECTOMY. The postoperative course was uneventful.  She was discharged to home on postoperative day 1 tolerating a regular diet, ambulating, pain controlled, passing flatus.  Consults: None  Significant Diagnostic Studies: None  Treatments: surgery: see above  Discharge Exam: Blood pressure (!) 93/58, pulse 62, temperature 99.2 F (37.3 C), temperature source Oral, resp. rate 14, height  (1.6 m), weight 161 lb 13.1 oz (73.4 kg), SpO2 100 %. General appearance: alert, cooperative and no distress Resp: clear to auscultation bilaterally Cardio: regular rate and rhythm, S1, S2 normal, no murmur, click, rub or gallop GI: soft, non-tender; bowel sounds normal; no masses,  no organomegaly Extremities: extremities normal, atraumatic, no cyanosis or edema Incision/Wound: Lap sites to the abdomen with dermabond without drainage  Disposition: Discharge disposition: 01-Home or Self Care       Discharge Instructions    Call MD for:  difficulty breathing, headache or visual disturbances   Complete by:  As directed    Call MD for:  extreme fatigue   Complete by:  As directed    Call MD for:  hives   Complete by:  As directed    Call MD for:  persistant dizziness or light-headedness   Complete by:  As directed    Call MD for:  persistant nausea and vomiting   Complete by:  As directed    Call MD for:  redness, tenderness, or signs of infection (pain, swelling, redness, odor or green/yellow discharge around incision site)   Complete by:  As directed    Call MD for:  severe uncontrolled pain   Complete by:  As directed    Call MD for:  temperature >100.4   Complete by:  As directed    Diet - low sodium heart healthy   Complete by:  As directed    Driving Restrictions   Complete by:  As directed    No driving for 1 week.  Do not take narcotics and drive.   Increase activity slowly   Complete by:  As directed    Lifting restrictions   Complete by:  As directed    No lifting greater than 10 lbs.   Sexual Activity Restrictions   Complete by:  As directed    No sexual activity, nothing in the vagina, for 8 weeks.     Allergies as of 09/20/2017      Reactions   Aspirin Anaphylaxis      Medication List    TAKE these medications   ferrous sulfate 325 (65 FE) MG tablet Take 1 tablet (325 mg total) by mouth 3 (three) times daily with meals. What changed:  when to take this   ibuprofen 200 MG tablet Commonly known as:  ADVIL,MOTRIN Take 200-600 mg by mouth daily as needed for headache or moderate pain.   Melatonin 10 MG Tabs Take 10 mg by mouth at bedtime as needed (sleep).   multivitamin tablet Take 1 tablet by mouth daily.  oxyCODONE 5 MG immediate release tablet Commonly known as:  Oxy IR/ROXICODONE Take 1 tablet (5 mg total) by mouth every 4 (four) hours as needed for severe pain.        Greater than thirty minutes were spend for face to face discharge instructions and discharge orders/summary in EPIC.   Signed: Doylene Bode 09/20/2017, 10:17 AM

## 2017-09-20 NOTE — Plan of Care (Signed)
Pt alert and oriented, ambulated in hall and states she feels much better this am. RN to monitor. Pain well controlled with PO pain meds. Plan to d/c home per MD order.

## 2017-10-19 ENCOUNTER — Inpatient Hospital Stay: Payer: Self-pay | Attending: Gynecologic Oncology | Admitting: Gynecologic Oncology

## 2017-10-29 ENCOUNTER — Telehealth: Payer: Self-pay | Admitting: *Deleted

## 2017-10-29 NOTE — Telephone Encounter (Signed)
Called using [employer] and left a message that she needs to call the office back to reschedule her missed appt

## 2017-10-31 ENCOUNTER — Telehealth: Payer: Self-pay | Admitting: *Deleted

## 2017-10-31 NOTE — Telephone Encounter (Signed)
Called the patient using [employer] and left message to call the office back to reschedule the missed post op appt

## 2017-11-01 NOTE — Telephone Encounter (Signed)
Pt returned our call to have f/u appt scheduled - appt scheduled for June 21 at 12 noon- arrive 15 min early- pt agreeable - no other needs at this time per pt.

## 2017-11-09 ENCOUNTER — Encounter: Payer: Self-pay | Admitting: Gynecologic Oncology

## 2017-11-09 ENCOUNTER — Inpatient Hospital Stay: Payer: Self-pay | Attending: Gynecologic Oncology | Admitting: Gynecologic Oncology

## 2017-11-09 VITALS — BP 112/73 | HR 74 | Temp 97.8°F | Resp 20 | Ht 63.0 in | Wt 148.7 lb

## 2017-11-09 DIAGNOSIS — Z87891 Personal history of nicotine dependence: Secondary | ICD-10-CM | POA: Insufficient documentation

## 2017-11-09 DIAGNOSIS — R971 Elevated cancer antigen 125 [CA 125]: Secondary | ICD-10-CM | POA: Insufficient documentation

## 2017-11-09 DIAGNOSIS — Z90722 Acquired absence of ovaries, bilateral: Secondary | ICD-10-CM | POA: Insufficient documentation

## 2017-11-09 DIAGNOSIS — N83201 Unspecified ovarian cyst, right side: Secondary | ICD-10-CM | POA: Insufficient documentation

## 2017-11-09 DIAGNOSIS — Z90710 Acquired absence of both cervix and uterus: Secondary | ICD-10-CM | POA: Insufficient documentation

## 2017-11-09 NOTE — Progress Notes (Signed)
Consult Note: Gyn-Onc  Consult was requested by Dr. Marice Potter for the evaluation of Stephanie Stanton 42 y.o. female  CC:  Chief Complaint  Patient presents with  . Cyst of right ovary  . Elevated CA-125    Assessment/Plan:  Stephanie. Huyen Stanton  is a 24 y.o.  year old who is s/p robotic hysterectomy, RSO and left salpingectomy on 09/19/17 for an enlarged uterus (11cm) abnormal uterine bleeding, and a 6cm right ovarian cyst.  Benign pathology. No issues with postop healing.   Can resume routine gyn care with Dr Marice Potter. Paps only indicated if she has a history of abnormal cytology or positive high risk HPV within the past 5 years.   HPI: Stephanie Stanton is a 80 year old who is seen in consultation at the request of Dr Marice Potter for abnormal uterine bleeding, a right ovarian cyst and elevated CA 125.  Patient reports abnormal uterine bleeding following a miscarriage D&C approximate 5 years ago.  The bleeding was cyclic until December 2018 when she began bleeding every day quite heavily.  She was seen at the MAU at [facility] on May 07, 2017 and was transfused 2 units of packed red blood cells.  An ultrasound scan was performed at that time which showed a 6 cm slightly complex right ovarian cyst but no uterine pathology was noted.  She was given Megace which did decrease the volume of flow but she continues to still have daily vaginal bleeding.  An endometrial biopsy performed by Dr. Marice Potter on June 08, 2017 revealed benign endometrium with hormone effect but no hyperplasia or malignancy.  The ultrasound from May 15, 2017 revealed a uterus measuring 11.2 x 7.3 x 7.4 cm with a 12 mm endometrial thickness.  The right ovary measured 6 x 4.7 x 5.6 cm with a minimally complex septated cyst measuring 5.2 cm.  The left ovary was grossly normal.  The impression of the cyst was that it was likely benign.  Ca1 25 was drawn on June 25, 2017 and was slightly elevated at 85.6.    The  patient has no significant family history of malignancy.  She has a history of 5 prior vaginal deliveries.  She has had only a cholecystectomy as a abdominal surgery.  She is a remote history of an IUD use.  Interval Hx:  On 5/1/ 19 she underwent robotic assisted total hysterectomy, right salpingo-oophorectomy, left salpingectomy. It was an uncomplicated surgery. Final pathology revealed a benign uterus with adenomyosis and the right ovary contained a leutieinized follicular cyst.  She has done well postop with no complaints.   Current Meds:  Outpatient Encounter Medications as of 11/09/2017  Medication Sig  . ferrous sulfate 325 (65 FE) MG tablet Take 1 tablet (325 mg total) by mouth 3 (three) times daily with meals. (Patient taking differently: Take 325 mg by mouth daily. )  . Melatonin 10 MG TABS Take 10 mg by mouth at bedtime as needed (sleep).  . Multiple Vitamin (MULTIVITAMIN) tablet Take 1 tablet by mouth daily.  . [DISCONTINUED] ibuprofen (ADVIL,MOTRIN) 200 MG tablet Take 200-600 mg by mouth daily as needed for headache or moderate pain.  . [DISCONTINUED] oxyCODONE (OXY IR/ROXICODONE) 5 MG immediate release tablet Take 1 tablet (5 mg total) by mouth every 4 (four) hours as needed for severe pain. (Patient not taking: Reported on 11/09/2017)   No facility-administered encounter medications on file as of 11/09/2017.     Allergy:  Allergies  Allergen Reactions  . Aspirin Anaphylaxis  Social Hx:   Social History   Socioeconomic History  . Marital status: Legally Separated    Spouse name: Not on file  . Number of children: Not on file  . Years of education: Not on file  . Highest education level: Not on file  Occupational History  . Not on file  Social Needs  . Financial resource strain: Not on file  . Food insecurity:    Worry: Not on file    Inability: Not on file  . Transportation needs:    Medical: Not on file    Non-medical: Not on file  Tobacco Use  . Smoking  status: Former Games developermoker  . Smokeless tobacco: Never Used  Substance and Sexual Activity  . Alcohol use: Yes    Comment: rarely   . Drug use: No  . Sexual activity: Never    Partners: Male    Birth control/protection: None  Lifestyle  . Physical activity:    Days per week: Not on file    Minutes per session: Not on file  . Stress: Not on file  Relationships  . Social connections:    Talks on phone: Not on file    Gets together: Not on file    Attends religious service: Not on file    Active member of club or organization: Not on file    Attends meetings of clubs or organizations: Not on file    Relationship status: Not on file  . Intimate partner violence:    Fear of current or ex partner: Not on file    Emotionally abused: Not on file    Physically abused: Not on file    Forced sexual activity: Not on file  Other Topics Concern  . Not on file  Social History Narrative  . Not on file    Past Surgical Hx:  Past Surgical History:  Procedure Laterality Date  . CHOLECYSTECTOMY    . CT ABD W & PELVIS WO CM  08/2010   IUD in place, constipation  . NASAL SEPTUM SURGERY     deviated septum  . ROBOTIC ASSISTED TOTAL HYSTERECTOMY WITH BILATERAL SALPINGO OOPHERECTOMY N/A 09/19/2017   Procedure: XI ROBOTIC ASSISTED TOTAL HYSTERECTOMY WITH BILATERAL SALPINGECTOMY AND RIGHT OOPHORECTOMY;  Surgeon: Adolphus Birchwoodossi, Jonasia Coiner, MD;  Location: [facility];  Service: Gynecology;  Laterality: N/A;  . WISDOM TOOTH EXTRACTION      Past Medical Hx:  Past Medical History:  Diagnosis Date  . Anemia   . Asthma   . Blood transfusion without reported diagnosis   . Headache    hx of rare migraine   . Hx of transfusion of packed red blood cells 04/2017  . Pneumonia    hx of   . PONV (postoperative nausea and vomiting)     Past Gynecological History:   No LMP recorded.  Family Hx:  Family History  Problem Relation Age of Onset  . Cancer Cousin        breast  . Diabetes Mother   . Hypertension Mother      Review of Systems:  Constitutional  Feels well,    ENT Normal appearing ears and nares bilaterally Skin/Breast  No rash, sores, jaundice, itching, dryness Cardiovascular  No chest pain, shortness of breath, or edema  Pulmonary  No cough or wheeze.  Gastro Intestinal  No nausea, vomitting, or diarrhoea. No bright red blood per rectum, no abdominal pain, change in bowel movement, or constipation.  Genito Urinary  No frequency, urgency, dysuria, no bleeding Musculo  Skeletal  No myalgia, arthralgia, joint swelling or pain  Neurologic  No weakness, numbness, change in gait,  Psychology  No depression, anxiety, insomnia.   Vitals:  Blood pressure 112/73, pulse 74, temperature 97.8 F (36.6 C), temperature source Oral, resp. rate 20, height 5\' 3"  (1.6 m), weight 148 lb 11.2 oz (67.4 kg), SpO2 99 %.  Physical Exam: WD in NAD Neck  Supple NROM, without any enlargements.  Lymph Node Survey No cervical supraclavicular or inguinal adenopathy Cardiovascular  Pulse normal rate, regularity and rhythm. S1 and S2 normal.  Lungs  Clear to auscultation bilateraly, without wheezes/crackles/rhonchi. Good air movement.  Skin  No rash/lesions/breakdown  Psychiatry  Alert and oriented to person, place, and time  Abdomen  Normoactive bowel sounds, abdomen soft, non-tender and thin, no palpable masses without evidence of hernia. Well healed incisions.  Back No CVA tenderness Genito Urinary  Vulva/vagina: Normal external female genitalia.   No lesions. No discharge or bleeding.  Bladder/urethra:  No lesions or masses, well supported bladder  Vagina: vaginal cuff well healed with no bleeding   Cervix and uterus surgically absent.   Adnexa: fullness in right adnexa.  Rectal  deferred Extremities  No bilateral cyanosis, clubbing or edema.   Luisa Dago, MD  11/09/2017, 12:25 PM

## 2017-11-09 NOTE — Patient Instructions (Signed)
You are cleared to resume all physical activity except for sex which you should wait another 2 weeks before attempting.  You should follow-up with Dr Marice Potterove for annual wellness checks once per year.  Dr Oliver Humossi's office can be reached at [phone].

## 2018-01-17 ENCOUNTER — Encounter (HOSPITAL_COMMUNITY): Payer: Self-pay | Admitting: Emergency Medicine

## 2018-01-17 ENCOUNTER — Ambulatory Visit (HOSPITAL_COMMUNITY)
Admission: EM | Admit: 2018-01-17 | Discharge: 2018-01-17 | Disposition: A | Discharge status: Home / Self Care | Payer: Self-pay | Attending: Family Medicine | Admitting: Family Medicine

## 2018-01-17 DIAGNOSIS — J019 Acute sinusitis, unspecified: Secondary | ICD-10-CM

## 2018-01-17 LAB — POCT URINALYSIS DIP (DEVICE)
Bilirubin Urine: NEGATIVE
GLUCOSE, UA: NEGATIVE mg/dL
KETONES UR: NEGATIVE mg/dL
LEUKOCYTES UA: NEGATIVE
Nitrite: NEGATIVE
Protein, ur: NEGATIVE mg/dL
Urobilinogen, UA: 0.2 mg/dL (ref 0.0–1.0)
pH: 7 (ref 5.0–8.0)

## 2018-01-17 MED ORDER — CETIRIZINE-PSEUDOEPHEDRINE ER 5-120 MG PO TB12: 1.0000 | ORAL_TABLET | Freq: Every day | ORAL | 0 refills | Status: DC

## 2018-01-17 MED ORDER — BUDESONIDE-FORMOTEROL FUMARATE 80-4.5 MCG/ACT IN AERO: 2.0000 | INHALATION_SPRAY | Freq: Every day | RESPIRATORY_TRACT | 12 refills | Status: DC

## 2018-01-17 MED ORDER — ALBUTEROL SULFATE HFA 108 (90 BASE) MCG/ACT IN AERS: 1.0000 | INHALATION_SPRAY | Freq: Four times a day (QID) | RESPIRATORY_TRACT | 2 refills | Status: AC | PRN

## 2018-01-17 MED ORDER — FLUCONAZOLE 150 MG PO TABS: 150.0000 mg | ORAL_TABLET | Freq: Every day | ORAL | 0 refills | Status: DC

## 2018-01-17 MED ORDER — FLUTICASONE PROPIONATE 50 MCG/ACT NA SUSP: 1.0000 | Freq: Every day | NASAL | 2 refills | Status: DC

## 2018-01-17 MED ORDER — AMOXICILLIN-POT CLAVULANATE 875-125 MG PO TABS: 1.0000 | ORAL_TABLET | Freq: Two times a day (BID) | ORAL | 0 refills | Status: DC

## 2018-01-17 MED ORDER — BECLOMETHASONE DIPROP HFA 40 MCG/ACT IN AERB: 1.0000 | INHALATION_SPRAY | Freq: Two times a day (BID) | RESPIRATORY_TRACT | 2 refills | Status: DC

## 2018-01-17 NOTE — Discharge Instructions (Addendum)
It was nice meeting you!!  We will go ahead and treat you today for sinus infection I would like for you to continue the Zyrtec daily and Flonase. I will refill your albuterol inhaler. I will also prescribe some Diflucan for vaginal yeast infection.  Follow up as needed for continued or worsening symptoms

## 2018-01-17 NOTE — ED Provider Notes (Signed)
[facility]    CSN: [id number] Arrival date & time: 01/17/18  1236     History   Chief Complaint Chief Complaint  Patient presents with  . Cough  . Dysuria    HPI Stephanie Stanton is a 42 y.o. female.   Patient is a 42 year old female with past medical history of anemia, persistent asthma, headache, rhinitis, allergies.  She presents with 2 months of wheezing, cough, shortness of breath.  It worsens with going outside and certain activities.  She admits to history of asthma and allergies but reports it is been a few years since she had any symptoms this serious.  She has been using her rescue inhaler multiple times a day over the past few months.  She is also been suffering with rhinitis and rhinorrhea over the past 2 months.  Denies any fever, chills, body aches.  Has some facial swelling and tenderness in the sinuses.  She is here today with concerns of her asthma getting worse.   She does not smoke Patient had a robotic hysterectomy on 09/19/2017. Past family medical history of cancer, diabetes, hypertension.        Past Medical History:  Diagnosis Date  . Anemia   . Asthma   . Blood transfusion without reported diagnosis   . Headache    hx of rare migraine   . Hx of transfusion of packed red blood cells 04/2017  . Pneumonia    hx of   . PONV (postoperative nausea and vomiting)     Patient Active Problem List   Diagnosis Date Noted  . Ovarian cyst 09/19/2017  . Right ovarian cyst 05/15/2017  . Anemia 05/14/2017  . Menorrhagia with regular cycle 05/14/2017  . Vaginal bleeding 05/14/2017  . Depression 07/29/2013  . HYPERTRIGLYCERIDEMIA 03/17/2009  . ANEMIA, IRON DEFICIENCY 05/29/2008  . RHINITIS, ALLERGIC 07/19/2006  . Asthma, persistent 07/19/2006    Past Surgical History:  Procedure Laterality Date  . CHOLECYSTECTOMY    . CT ABD W & PELVIS WO CM  08/2010   IUD in place, constipation  . NASAL SEPTUM SURGERY     deviated septum  .  ROBOTIC ASSISTED TOTAL HYSTERECTOMY WITH BILATERAL SALPINGO OOPHERECTOMY N/A 09/19/2017   Procedure: XI ROBOTIC ASSISTED TOTAL HYSTERECTOMY WITH BILATERAL SALPINGECTOMY AND RIGHT OOPHORECTOMY;  Surgeon: Adolphus Birchwoodossi, Emma, MD;  Location: [facility];  Service: Gynecology;  Laterality: N/A;  . WISDOM TOOTH EXTRACTION      OB History    Gravida  9   Para  5   Term  5   Preterm      AB  4   Living  5     SAB  2   TAB  2   Ectopic      Multiple      Live Births               Home Medications    Prior to Admission medications   Medication Sig Start Date End Date Taking? Authorizing Provider  albuterol (PROVENTIL HFA;VENTOLIN HFA) 108 (90 Base) MCG/ACT inhaler Inhale 1-2 puffs into the lungs every 6 (six) hours as needed for wheezing or shortness of breath. 01/17/18   Dahlia ByesBast, Azariyah Luhrs A, NP  amoxicillin-clavulanate (AUGMENTIN) 875-125 MG tablet Take 1 tablet by mouth every 12 (twelve) hours. 01/17/18   Dahlia ByesBast, Chene Kasinger A, NP  budesonide-formoterol (SYMBICORT) 80-4.5 MCG/ACT inhaler Inhale 2 puffs into the lungs daily. 01/17/18   Dahlia ByesBast, Peniel Hass A, NP  cetirizine-pseudoephedrine (ZYRTEC-D) 5-120 MG tablet Take 1 tablet  by mouth daily. 01/17/18   Dahlia Byes A, NP  ferrous sulfate 325 (65 FE) MG tablet Take 1 tablet (325 mg total) by mouth 3 (three) times daily with meals. Patient taking differently: Take 325 mg by mouth daily.  04/23/14   Kuneff, Renee A, DO  fluconazole (DIFLUCAN) 150 MG tablet Take 1 tablet (150 mg total) by mouth daily. 01/17/18   Angeles Paolucci, Gloris Manchester A, NP  fluticasone (FLONASE) 50 MCG/ACT nasal spray Place 1 spray into both nostrils daily. 01/17/18   Dahlia Byes A, NP  Melatonin 10 MG TABS Take 10 mg by mouth at bedtime as needed (sleep).    [provider]  Multiple Vitamin (MULTIVITAMIN) tablet Take 1 tablet by mouth daily.    [provider]    Family History Family History  Problem Relation Age of Onset  . Cancer Cousin        breast  . Diabetes Mother   .  Hypertension Mother     Social History Social History   Tobacco Use  . Smoking status: Former Games developer  . Smokeless tobacco: Never Used  Substance Use Topics  . Alcohol use: Yes    Comment: rarely   . Drug use: No     Allergies   Aspirin   Review of Systems Review of Systems  Constitutional: Negative for activity change, appetite change, fatigue, fever and unexpected weight change.  HENT: Positive for congestion, ear pain, postnasal drip, rhinorrhea, sinus pressure and sinus pain.   Respiratory: Positive for cough, chest tightness, shortness of breath and wheezing.   Musculoskeletal: Negative for neck pain.  Skin: Negative for rash.  Allergic/Immunologic: Negative for immunocompromised state.  Hematological: Does not bruise/bleed easily.     Physical Exam Triage Vital Signs ED Triage Vitals [01/17/18 1358]  Enc Vitals Group     BP 114/73     Pulse Rate 69     Resp 16     Temp 97.6 F (36.4 C)     Temp src      SpO2 100 %     Weight      Height      Head Circumference      Peak Flow      Pain Score      Pain Loc      Pain Edu?      Excl. in GC?    No data found.  Updated Vital Signs BP 114/73   Pulse 69   Temp 97.6 F (36.4 C)   Resp 16   LMP 05/07/2017   SpO2 100%   Visual Acuity Right Eye Distance:   Left Eye Distance:   Bilateral Distance:    Right Eye Near:   Left Eye Near:    Bilateral Near:     Physical Exam  Constitutional: She is oriented to person, place, and time. She appears well-developed and well-nourished.  Very pleasant.  Nontoxic or ill-appearing  HENT:  Head: Normocephalic and atraumatic.  Left TM translucent.  Right middle ear effusion Posterior oropharynx mildly erythematous with drainage noted.  No tonsillar swelling or exudates.  No adenopathy Some maxillary facial tenderness.  Nasal swelling noted  Eyes: Conjunctivae are normal.  Neck: Normal range of motion.  Pulmonary/Chest: Effort normal.  Lungs clear in all  fields.  No wheezing or rhonchi.  No dyspnea  Abdominal: Soft.  Musculoskeletal: Normal range of motion.  Neurological: She is alert and oriented to person, place, and time.  Skin: Skin is warm.  Psychiatric: She has  a normal mood and affect.  Nursing note and vitals reviewed.    UC Treatments / Results  Labs (all labs ordered are listed, but only abnormal results are displayed) Labs Reviewed  POCT URINALYSIS DIP (DEVICE) - Abnormal; Notable for the following components:      Result Value   Hgb urine dipstick TRACE (*)    All other components within normal limits    EKG None  Radiology No results found.  Procedures Procedures (including critical care time)  Medications Ordered in UC Medications - No data to display  Initial Impression / Assessment and Plan / UC Course  I have reviewed the triage vital signs and the nursing notes.  Pertinent labs & imaging results that were available during my care of the patient were reviewed by me and considered in my medical decision making (see chart for details).     Patient is having persistent asthma symptoms.  Has been using her rescue inhaler twice a day most days.  She is also been using Allegra and Nasacort.   Refilled her albuterol rescue inhaler and prescribed daily maintenance Symbicort. Flonase nasal spray and Zyrtec D for rhino sinusitis.  We will try Diflucan for vaginitis symptoms.  Urine was negative for infection.  Final Clinical Impressions(s) / UC Diagnoses   Final diagnoses:  Acute sinusitis, recurrence not specified, unspecified location     Discharge Instructions     It was nice meeting you!!  We will go ahead and treat you today for sinus infection I would like for you to continue the Zyrtec daily and Flonase. I will refill your albuterol inhaler. I will also prescribe some Diflucan for vaginal yeast infection.  Follow up as needed for continued or worsening symptoms    ED Prescriptions     Medication Sig Dispense Auth. Provider   amoxicillin-clavulanate (AUGMENTIN) 875-125 MG tablet Take 1 tablet by mouth every 12 (twelve) hours. 14 tablet Yifan Auker A, NP   cetirizine-pseudoephedrine (ZYRTEC-D) 5-120 MG tablet Take 1 tablet by mouth daily. 30 tablet Edwardine Deschepper A, NP   fluticasone (FLONASE) 50 MCG/ACT nasal spray Place 1 spray into both nostrils daily. 16 g Deeya Richeson A, NP   fluconazole (DIFLUCAN) 150 MG tablet Take 1 tablet (150 mg total) by mouth daily. 2 tablet Mahir Prabhakar A, NP   beclomethasone (QVAR REDIHALER) 40 MCG/ACT inhaler  (Status: Discontinued) Inhale 1 puff into the lungs 2 (two) times daily. 1 Inhaler Nancey Kreitz A, NP   albuterol (PROVENTIL HFA;VENTOLIN HFA) 108 (90 Base) MCG/ACT inhaler Inhale 1-2 puffs into the lungs every 6 (six) hours as needed for wheezing or shortness of breath. 1 Inhaler Jermall Isaacson A, NP   budesonide-formoterol (SYMBICORT) 80-4.5 MCG/ACT inhaler  (Status: Discontinued) Inhale 2 puffs into the lungs daily. 1 Inhaler Neeti Knudtson A, NP   budesonide-formoterol (SYMBICORT) 80-4.5 MCG/ACT inhaler Inhale 2 puffs into the lungs daily. 1 Inhaler Dahlia Byes A, NP     Controlled Substance Prescriptions [place] Controlled Substance Registry consulted? Not Applicable   Janace Aris, NP 01/17/18 1659

## 2018-01-17 NOTE — ED Triage Notes (Signed)
Pt states she used to have asthma issuse years and years ago but for the last 5 years hasn't had a problem with it and isn't taking anything for asthma. Pt states the last two months shes had issues with her breathing, feeling wheezy, pt has inhaler but possibly expired. Pt also states for the last few days it burns when she pees.

## 2018-02-18 ENCOUNTER — Other Ambulatory Visit (HOSPITAL_COMMUNITY): Payer: Self-pay | Admitting: *Deleted

## 2018-02-18 ENCOUNTER — Encounter: Payer: Self-pay | Admitting: Family Medicine

## 2018-02-18 ENCOUNTER — Ambulatory Visit (INDEPENDENT_AMBULATORY_CARE_PROVIDER_SITE_OTHER): Payer: Self-pay | Admitting: Family Medicine

## 2018-02-18 VITALS — BP 123/66 | HR 72 | Ht 63.0 in | Wt 147.4 lb

## 2018-02-18 DIAGNOSIS — N6320 Unspecified lump in the left breast, unspecified quadrant: Secondary | ICD-10-CM

## 2018-02-18 DIAGNOSIS — D508 Other iron deficiency anemias: Secondary | ICD-10-CM

## 2018-02-18 DIAGNOSIS — N63 Unspecified lump in unspecified breast: Secondary | ICD-10-CM

## 2018-02-18 LAB — CBC
HEMATOCRIT: 39.9 % (ref 34.0–46.6)
Hemoglobin: 13.2 g/dL (ref 11.1–15.9)
MCH: 30.4 pg (ref 26.6–33.0)
MCHC: 33.1 g/dL (ref 31.5–35.7)
MCV: 92 fL (ref 79–97)
PLATELETS: 386 10*3/uL (ref 150–450)
RBC: 4.34 x10E6/uL (ref 3.77–5.28)
RDW: 13.7 % (ref 12.3–15.4)
WBC: 8 10*3/uL (ref 3.4–10.8)

## 2018-02-18 NOTE — Progress Notes (Signed)
Email sent to [facility], to get pt set up with [facility] for mammogram.

## 2018-02-18 NOTE — Progress Notes (Signed)
   Subjective:    Patient ID: Stephanie Stanton, female    DOB: [birth year], 42 y.o.   MRN: [id number]  HPI  Patient seen for continuous breast pain in left upper breast. Also feel hard area and brownish discharge from nipple. Started a couple days ago. Not improving or worsening. Had mammogram in 2012 for breast lump - mid outer left breast. Appeared benign.   Review of Systems     Objective:   Physical Exam  Constitutional: She appears well-developed and well-nourished.  Pulmonary/Chest:            Assessment & Plan:  1. Breast lump Referred to [facility] - MM Digital Diagnostic Bilat; Future  2. Iron deficiency anemia secondary to inadequate dietary iron intake - CBC

## 2018-03-19 ENCOUNTER — Ambulatory Visit: Payer: Self-pay

## 2018-03-19 ENCOUNTER — Ambulatory Visit (HOSPITAL_COMMUNITY)
Admission: RE | Admit: 2018-03-19 | Discharge: 2018-03-19 | Disposition: A | Discharge status: Home / Self Care | Payer: Self-pay | Source: Ambulatory Visit | Attending: Obstetrics and Gynecology | Admitting: Obstetrics and Gynecology

## 2018-03-19 ENCOUNTER — Ambulatory Visit
Admission: RE | Admit: 2018-03-19 | Discharge: 2018-03-19 | Disposition: A | Discharge status: Home / Self Care | Payer: No Typology Code available for payment source | Source: Ambulatory Visit

## 2018-03-19 ENCOUNTER — Encounter (HOSPITAL_COMMUNITY): Payer: Self-pay

## 2018-03-19 ENCOUNTER — Ambulatory Visit
Admission: RE | Admit: 2018-03-19 | Discharge: 2018-03-19 | Disposition: A | Discharge status: Home / Self Care | Payer: No Typology Code available for payment source | Source: Ambulatory Visit | Attending: Obstetrics and Gynecology | Admitting: Obstetrics and Gynecology

## 2018-03-19 ENCOUNTER — Ambulatory Visit: Payer: No Typology Code available for payment source

## 2018-03-19 VITALS — BP 104/62

## 2018-03-19 DIAGNOSIS — N6320 Unspecified lump in the left breast, unspecified quadrant: Secondary | ICD-10-CM

## 2018-03-19 DIAGNOSIS — N6323 Unspecified lump in the left breast, lower outer quadrant: Secondary | ICD-10-CM

## 2018-03-19 DIAGNOSIS — Z1239 Encounter for other screening for malignant neoplasm of breast: Secondary | ICD-10-CM

## 2018-03-19 NOTE — Patient Instructions (Signed)
Explained breast self awareness with Stephanie Stanton. Patient did not need a Pap smear today due to last Pap smear and HPV typing was 06/08/2017. Let patient know that she doesn't need any further Pap smears due to her history of a hysterectomy for benign reasons. Referred patient to the [facility] for a diagnostic mammogram and left breast ultrasound per recommendation. Appointment scheduled for Tuesday, March 19, 2018 at 1050. Patient aware of appointment and will be there. Stephanie Stanton verbalized understanding.  Audrey Thull, Kathaleen Maser, RN 8:28 AM

## 2018-03-19 NOTE — Progress Notes (Addendum)
Patient referred to [facility] by the [facility] due to a left breast diagnostic mammogram was recommended in 69-months. Last diagnostic mammogram completed 08/17/2010.   Pap Smear: Pap smear not completed today. Last Pap smear was 06/08/2017 at the [facility] and normal with negative HPV. Per patient has a history of an abnormal Pap smear 20 years ago that a repeat Pap smear was completed for follow-up that was normal. Patient has a history of a hysterectomy 73-months ago due to AUB. Patient doesn't need any further Pap smears due to her history of a hysterectomy for benign reasons per [facility]CP and ACOG guidelines. Last  Pap smear result is in Epic.  Physical exam: Breasts Breasts symmetrical. No skin abnormalities bilateral breasts. No nipple retraction bilateral breasts. No nipple discharge bilateral breasts. No lymphadenopathy. No lumps palpated right breast. Palpated a pea sized lump within the left breast at 4 o'clock 4 cm from the nipple. No complaints of pain or tenderness on exam. Referred patient to the [facility] for a diagnostic mammogram and left breast ultrasound per recommendation. Appointment scheduled for Tuesday, March 19, 2018 at 1050.        Pelvic/Bimanual No Pap smear completed today since last Pap smear and HPV typing was 06/08/2017 and patient has a history of a hysterectomy for benign reasons. Pap smear not indicated per B[facility]CP guidelines.   Smoking History: Patient is a former smoker that quit over 20 years ago.  Patient Navigation: Patient education provided. Access to services provided for patient through [facility] program. Spanish interpreter provided.   Breast and Cervical Cancer Risk Assessment: Patient has no family history of breast cancer, known genetic mutations, or radiation treatment to the chest before age 41. Patient has no history of cervical dysplasia, immunocompromised, or DES exposure  in-utero.  Risk Assessment    Risk Scores      03/19/2018   Last edited by: Lynnell Dike, LPN   5-year risk: 0.4 %   Lifetime risk: 5 %         Used Spanish interpreter Natale Lay from [place].

## 2018-03-25 ENCOUNTER — Encounter (HOSPITAL_COMMUNITY): Payer: Self-pay | Admitting: *Deleted

## 2018-04-14 ENCOUNTER — Encounter (HOSPITAL_COMMUNITY): Payer: Self-pay | Admitting: Emergency Medicine

## 2018-04-14 ENCOUNTER — Emergency Department (HOSPITAL_COMMUNITY)
Admission: EM | Admit: 2018-04-14 | Discharge: 2018-04-14 | Disposition: A | Discharge status: Home / Self Care | Payer: Self-pay | Attending: Emergency Medicine | Admitting: Emergency Medicine

## 2018-04-14 ENCOUNTER — Other Ambulatory Visit: Payer: Self-pay

## 2018-04-14 ENCOUNTER — Emergency Department (HOSPITAL_COMMUNITY): Payer: Self-pay

## 2018-04-14 DIAGNOSIS — J4521 Mild intermittent asthma with (acute) exacerbation: Secondary | ICD-10-CM | POA: Insufficient documentation

## 2018-04-14 DIAGNOSIS — J069 Acute upper respiratory infection, unspecified: Secondary | ICD-10-CM | POA: Insufficient documentation

## 2018-04-14 DIAGNOSIS — Z79899 Other long term (current) drug therapy: Secondary | ICD-10-CM | POA: Insufficient documentation

## 2018-04-14 DIAGNOSIS — Z87891 Personal history of nicotine dependence: Secondary | ICD-10-CM | POA: Insufficient documentation

## 2018-04-14 LAB — CBC WITH DIFFERENTIAL/PLATELET
Abs Immature Granulocytes: 0.02 10*3/uL (ref 0.00–0.07)
Basophils Absolute: 0.1 10*3/uL (ref 0.0–0.1)
Basophils Relative: 1 %
Eosinophils Absolute: 0.4 10*3/uL (ref 0.0–0.5)
Eosinophils Relative: 4 %
HCT: 44.1 % (ref 36.0–46.0)
Hemoglobin: 14.1 g/dL (ref 12.0–15.0)
Immature Granulocytes: 0 %
Lymphocytes Relative: 20 %
Lymphs Abs: 1.8 10*3/uL (ref 0.7–4.0)
MCH: 29.3 pg (ref 26.0–34.0)
MCHC: 32 g/dL (ref 30.0–36.0)
MCV: 91.7 fL (ref 80.0–100.0)
Monocytes Absolute: 0.8 10*3/uL (ref 0.1–1.0)
Monocytes Relative: 9 %
Neutro Abs: 6 10*3/uL (ref 1.7–7.7)
Neutrophils Relative %: 66 %
Platelets: 306 10*3/uL (ref 150–400)
RBC: 4.81 MIL/uL (ref 3.87–5.11)
RDW: 11.9 % (ref 11.5–15.5)
WBC: 9.1 10*3/uL (ref 4.0–10.5)
nRBC: 0 % (ref 0.0–0.2)

## 2018-04-14 LAB — BASIC METABOLIC PANEL
Anion gap: 9 (ref 5–15)
BUN: 7 mg/dL (ref 6–20)
CO2: 22 mmol/L (ref 22–32)
Calcium: 9 mg/dL (ref 8.9–10.3)
Chloride: 105 mmol/L (ref 98–111)
Creatinine, Ser: 0.52 mg/dL (ref 0.44–1.00)
GFR calc Af Amer: >60 mL/min (ref >60)
GFR calc non Af Amer: >60 mL/min (ref >60)
Glucose, Bld: 91 mg/dL (ref 70–99)
Potassium: 3.6 mmol/L (ref 3.5–5.1)
Sodium: 136 mmol/L (ref 135–145)

## 2018-04-14 MED ORDER — PREDNISONE 20 MG PO TABS
60.0000 mg | ORAL_TABLET | Freq: Once | ORAL | Status: AC
  Administered 2018-04-14: 60 mg via ORAL
  Filled 2018-04-14: qty 3

## 2018-04-14 MED ORDER — IPRATROPIUM-ALBUTEROL 0.5-2.5 (3) MG/3ML IN SOLN
3.0000 mL | Freq: Once | RESPIRATORY_TRACT | Status: AC
  Administered 2018-04-14: 3 mL via RESPIRATORY_TRACT
  Filled 2018-04-14: qty 3

## 2018-04-14 MED ORDER — PREDNISONE 10 MG PO TABS: 40.0000 mg | ORAL_TABLET | Freq: Every day | ORAL | 0 refills | Status: AC

## 2018-04-14 MED ORDER — FLUTICASONE PROPIONATE 50 MCG/ACT NA SUSP: 2.0000 | Freq: Every day | NASAL | 0 refills | Status: AC

## 2018-04-14 MED ORDER — ALBUTEROL SULFATE HFA 108 (90 BASE) MCG/ACT IN AERS
1.0000 | INHALATION_SPRAY | Freq: Once | RESPIRATORY_TRACT | Status: AC
  Administered 2018-04-14: 1 via RESPIRATORY_TRACT
  Filled 2018-04-14: qty 6.7

## 2018-04-14 NOTE — ED Triage Notes (Signed)
Patient c/o shortness of breath onset of this am and sharp pain with inspiration. Patient also having congestion and cough with chills.

## 2018-04-14 NOTE — ED Provider Notes (Signed)
[facility] EMERGENCY DEPARTMENT Provider Note   CSN: [id number] Arrival date & time: 04/14/18  1023     History   Chief Complaint Chief Complaint  Patient presents with  . Shortness of Breath    HPI Maedell Hedger is a 42 y.o. female with history of asthma, anemia, and menorrhagia presents for evaluation of acute onset, persistent shortness of breath beginning this morning when awakening.  She notes associated wheezing, nasal congestion, sore throat, and chills.  Notes shortness of breath worsens with activity and she feels lightheaded with standing.  Describes a generalized chest tightness but denies any other chest pains.  No abdominal pain, nausea, vomiting, or fevers.  States that she has a history of asthma but this was well-controlled for several years but has been gone worsening in the past 6 months or so.  Does not have an albuterol inhaler.  Has not tried anything for her symptoms.  Up-to-date on her immunizations. Is a nonsmoker.   The history is provided by the patient.    Past Medical History:  Diagnosis Date  . Anemia   . Asthma   . Blood transfusion without reported diagnosis   . Headache    hx of rare migraine   . Hx of transfusion of packed red blood cells 04/2017  . Pneumonia    hx of   . PONV (postoperative nausea and vomiting)     Patient Active Problem List   Diagnosis Date Noted  . Ovarian cyst 09/19/2017  . Right ovarian cyst 05/15/2017  . Anemia 05/14/2017  . Menorrhagia with regular cycle 05/14/2017  . Vaginal bleeding 05/14/2017  . Depression 07/29/2013  . HYPERTRIGLYCERIDEMIA 03/17/2009  . ANEMIA, IRON DEFICIENCY 05/29/2008  . RHINITIS, ALLERGIC 07/19/2006  . Asthma, persistent 07/19/2006    Past Surgical History:  Procedure Laterality Date  . ABDOMINAL HYSTERECTOMY    . CHOLECYSTECTOMY    . CT ABD W & PELVIS WO CM  08/2010   IUD in place, constipation  . NASAL SEPTUM SURGERY     deviated septum  . ROBOTIC  ASSISTED TOTAL HYSTERECTOMY WITH BILATERAL SALPINGO OOPHERECTOMY N/A 09/19/2017   Procedure: XI ROBOTIC ASSISTED TOTAL HYSTERECTOMY WITH BILATERAL SALPINGECTOMY AND RIGHT OOPHORECTOMY;  Surgeon: Adolphus Birchwood, MD;  Location: [facility];  Service: Gynecology;  Laterality: N/A;  . WISDOM TOOTH EXTRACTION       OB History    Gravida  9   Para  5   Term  5   Preterm      AB  4   Living  5     SAB  2   TAB  2   Ectopic      Multiple      Live Births  5            Home Medications    Prior to Admission medications   Medication Sig Start Date End Date Taking? Authorizing Provider  albuterol (PROVENTIL HFA;VENTOLIN HFA) 108 (90 Base) MCG/ACT inhaler Inhale 1-2 puffs into the lungs every 6 (six) hours as needed for wheezing or shortness of breath. 01/17/18   Bast, Traci A, NP  ferrous sulfate 325 (65 FE) MG tablet Take 1 tablet (325 mg total) by mouth 3 (three) times daily with meals. Patient not taking: Reported on 03/19/2018 04/23/14   Felix Pacini A, DO  fluticasone (FLONASE) 50 MCG/ACT nasal spray Place 2 sprays into both nostrils daily. 04/14/18   Shirl Weir A, PA-C  Multiple Vitamin (MULTIVITAMIN) tablet Take 1 tablet  by mouth daily.    [provider]  predniSONE (DELTASONE) 10 MG tablet Take 4 tablets (40 mg total) by mouth daily with breakfast for 5 days. 04/14/18 04/19/18  Jeanie Sewer, PA-C    Family History Family History  Problem Relation Age of Onset  . Cancer Cousin        breast  . Diabetes Mother   . Hypertension Mother   . Breast cancer Neg Hx     Social History Social History   Tobacco Use  . Smoking status: Former Games developer  . Smokeless tobacco: Never Used  Substance Use Topics  . Alcohol use: Yes    Comment: rarely   . Drug use: No     Allergies   Aspirin   Review of Systems Review of Systems  Constitutional: Positive for chills. Negative for fever.  HENT: Positive for congestion, postnasal drip, rhinorrhea and sore throat.  Negative for drooling and trouble swallowing.   Respiratory: Positive for cough, chest tightness, shortness of breath and wheezing.   Cardiovascular: Negative for leg swelling.  Gastrointestinal: Negative for abdominal pain, nausea and vomiting.  Neurological: Positive for light-headedness.  All other systems reviewed and are negative.    Physical Exam Updated Vital Signs BP (!) 110/58 (BP Location: Left Arm)   Pulse 95   Temp 98.2 F (36.8 C) (Oral)   Resp 20   Ht 5\' 3"  (1.6 m)   Wt 65.8 kg   LMP 09/19/2017   SpO2 98%   BMI 25.69 kg/m   Physical Exam  Constitutional: She appears well-developed and well-nourished. No distress.  HENT:  Head: Normocephalic and atraumatic.  Right Ear: External ear and ear canal normal. A middle ear effusion is present.  Left Ear: External ear and ear canal normal. A middle ear effusion is present.  Nose: Mucosal edema and rhinorrhea present. No septal deviation. Right sinus exhibits no maxillary sinus tenderness and no frontal sinus tenderness. Left sinus exhibits no maxillary sinus tenderness and no frontal sinus tenderness.  Mouth/Throat: Uvula is midline. No trismus in the jaw. No uvula swelling. No oropharyngeal exudate, posterior oropharyngeal edema or posterior oropharyngeal erythema. No tonsillar exudate.  Tolerating secretions without difficulty.  Normal phonation.  Posterior oropharynx with postnasal drip.    Eyes: Conjunctivae are normal. Right eye exhibits no discharge. Left eye exhibits no discharge.  Neck: No JVD present. No tracheal deviation present.  Cardiovascular: Normal rate, regular rhythm and normal heart sounds.  2+ radial and DP/PT pulses bilaterally, Homans sign absent bilaterally, no lower extremity edema, no palpable cords, compartments are soft   Pulmonary/Chest: Effort normal. Tachypnea noted. She has wheezes. She exhibits no tenderness.  Diffuse expiratory wheezing.  Speaking in full sentences without difficulty.    Abdominal: Soft. Bowel sounds are normal. She exhibits no distension.  Musculoskeletal: She exhibits no edema.       Right lower leg: Normal. She exhibits no tenderness and no edema.       Left lower leg: Normal. She exhibits no tenderness and no edema.  Neurological: She is alert.  Skin: No erythema.  Psychiatric: She has a normal mood and affect. Her behavior is normal.  Nursing note and vitals reviewed.    ED Treatments / Results  Labs (all labs ordered are listed, but only abnormal results are displayed) Labs Reviewed  CBC WITH DIFFERENTIAL/PLATELET  BASIC METABOLIC PANEL    EKG None  Radiology Dg Chest 2 View  Result Date: 04/14/2018 CLINICAL DATA:  Cough, shortness of breath  and sharp pain with inspiration onset this morning, chills, history asthma EXAM: CHEST - 2 VIEW COMPARISON:  11/01/2011 FINDINGS: Normal heart size, mediastinal contours, and pulmonary vascularity. Lungs clear. No infiltrate, pleural effusion or pneumothorax. Osseous structures unremarkable. IMPRESSION: No acute abnormalities. Electronically Signed   By: Ulyses SouthwardMark  Boles M.D.   On: 04/14/2018 11:52    Procedures Procedures (including critical care time)  Medications Ordered in ED Medications  albuterol (PROVENTIL HFA;VENTOLIN HFA) 108 (90 Base) MCG/ACT inhaler 1 puff (has no administration in time range)  ipratropium-albuterol (DUONEB) 0.5-2.5 (3) MG/3ML nebulizer solution 3 mL (3 mLs Nebulization Given 04/14/18 1047)  predniSONE (DELTASONE) tablet 60 mg (60 mg Oral Given 04/14/18 1055)     Initial Impression / Assessment and Plan / ED Course  I have reviewed the triage vital signs and the nursing notes.  Pertinent labs & imaging results that were available during my care of the patient were reviewed by me and considered in my medical decision making (see chart for details).     Patient with acute onset of wheezing, URI symptoms.  She is afebrile, vital signs are stable.  She is nontoxic in  appearance.  Diffuse wheezing on auscultation of the lungs though speaking in full sentences without difficulty.  No concern for strep pharyngitis, PTA, retropharyngeal abscess, Ludwig's angina, angioedema or deep space neck infection.  No fever or meningeal signs.  Will obtain chest x-ray lab work for further evaluation and give breathing treatment and steroids in the ED.  Doubt ACS/MI or PE as symptoms appear to be infectious in etiology. 11:39 AM Patient states she is now breathing at baseline.  No wheezing on auscultation of the lungs.  She was ambulated on pulse oximetry with stable SPO2 saturations.  Her chest x-ray shows no acute cardia pulmonary abnormality, no evidence of edema or consolidation.  Low concern for pneumonia in the absence of fever and with only 1 day of symptoms.  Lab work reviewed by me unremarkable.  Stable for discharge home with follow-up with PCP for reevaluation.  Will discharge with an albuterol inhaler and a prescription for prednisone and Flonase.  Discussed antibiotics are not indicated for her current symptoms.  Discussed her ED return precautions. Pt verbalized understanding of and agreement with plan and is safe for discharge home at this time.   Final Clinical Impressions(s) / ED Diagnoses   Final diagnoses:  Mild intermittent asthma with exacerbation  URI with cough and congestion    ED Discharge Orders         Ordered    predniSONE (DELTASONE) 10 MG tablet  Daily with breakfast     04/14/18 1206    fluticasone (FLONASE) 50 MCG/ACT nasal spray  Daily     04/14/18 1206           Jeanie SewerFawze, Denario Bagot A, PA-C 04/14/18 1213    Loren RacerYelverton, David, MD 04/16/18 2252

## 2018-04-14 NOTE — ED Notes (Signed)
Patient transported to X-ray 

## 2018-04-14 NOTE — Discharge Instructions (Addendum)
Drink plenty of water and get plenty of rest.  Use your albuterol inhaler every 4-6 hours as needed for shortness of breath.  Start taking the prednisone beginning tomorrow, you received the first dose in the emergency department today.  You can also take over-the-counter allergy medicines or cold medications for your nasal congestion, sore throat, and cough.  Use warm water salt gargles, warm teas, and honey for sore throat.  Flonase as needed for nasal congestion.  Follow-up with your primary care physician for reevaluation of symptoms.  If you do not have a primary care doctor, you can call [place] [facility] tomorrow and tell them you were referred from the emergency department.  It may take some time but they will make you an appointment and they are very affordable for people who do not have health insurance.  Return to the emergency department if any concerning signs or symptoms develop such as worsening shortness of breath despite medications, chest pains, high fevers, persistent vomiting, or passing out.

## 2018-04-14 NOTE — ED Notes (Signed)
Patient ambulated in hallway maintaining oxygen saturation of 97% or greater.

## 2019-02-21 ENCOUNTER — Encounter (HOSPITAL_COMMUNITY): Payer: Self-pay | Admitting: Emergency Medicine

## 2019-02-21 ENCOUNTER — Other Ambulatory Visit: Payer: Self-pay

## 2019-02-21 ENCOUNTER — Emergency Department (HOSPITAL_COMMUNITY)
Admission: EM | Admit: 2019-02-21 | Discharge: 2019-02-21 | Disposition: A | Discharge status: Home / Self Care | Payer: No Typology Code available for payment source | Attending: Emergency Medicine | Admitting: Emergency Medicine

## 2019-02-21 ENCOUNTER — Emergency Department (HOSPITAL_COMMUNITY): Payer: No Typology Code available for payment source

## 2019-02-21 DIAGNOSIS — Z20828 Contact with and (suspected) exposure to other viral communicable diseases: Secondary | ICD-10-CM | POA: Insufficient documentation

## 2019-02-21 DIAGNOSIS — Z87891 Personal history of nicotine dependence: Secondary | ICD-10-CM | POA: Insufficient documentation

## 2019-02-21 DIAGNOSIS — J4541 Moderate persistent asthma with (acute) exacerbation: Secondary | ICD-10-CM

## 2019-02-21 DIAGNOSIS — U071 COVID-19: Secondary | ICD-10-CM

## 2019-02-21 MED ORDER — PREDNISONE 20 MG PO TABS
40.0000 mg | ORAL_TABLET | Freq: Once | ORAL | Status: AC
  Administered 2019-02-21: 40 mg via ORAL
  Filled 2019-02-21: qty 2

## 2019-02-21 MED ORDER — PREDNISONE 10 MG PO TABS: 40.0000 mg | ORAL_TABLET | Freq: Every day | ORAL | 0 refills | Status: AC

## 2019-02-21 MED ORDER — DEXAMETHASONE SODIUM PHOSPHATE 10 MG/ML IJ SOLN: 10.0000 mg | Freq: Once | INTRAMUSCULAR | Status: DC

## 2019-02-21 NOTE — ED Triage Notes (Signed)
Patient c/o cough with sore throat x4 days.

## 2019-02-21 NOTE — Discharge Instructions (Addendum)
Continue using your inhaler as prescribed. Take the steroids as prescribed. Return to the ED if you start to experience worsening symptoms, shortness of breath, chest pain, leg swelling, vomiting or coughing up blood.

## 2019-02-21 NOTE — ED Provider Notes (Signed)
[facility] DEPT Provider Note   CSN: [id number] Arrival date & time: 02/21/19  1148     History   Chief Complaint Chief Complaint  Patient presents with  . Cough    HPI Stephanie Stanton is a 43 y.o. female with a past medical history of asthma, presents to ED for evaluation of cough, rhinorrhea, sore throat, wheezing for the past week. Reports sick contacts with similar symptoms including HER-2 sons.  She has not been taking any over-the-counter medications to help with her symptoms.  She has been using her inhaler with only mild improvement in her symptoms.  She went to employee health 2 days ago and was tested for COVID.  States that her results just came back as positive.  She denies any shortness of breath, chest pain, injuries or falls, abdominal pain or fever.     HPI  Past Medical History:  Diagnosis Date  . Anemia   . Asthma   . Blood transfusion without reported diagnosis   . Headache    hx of rare migraine   . Hx of transfusion of packed red blood cells 04/2017  . Pneumonia    hx of   . PONV (postoperative nausea and vomiting)     Patient Active Problem List   Diagnosis Date Noted  . Ovarian cyst 09/19/2017  . Right ovarian cyst 05/15/2017  . Anemia 05/14/2017  . Menorrhagia with regular cycle 05/14/2017  . Vaginal bleeding 05/14/2017  . Depression 07/29/2013  . HYPERTRIGLYCERIDEMIA 03/17/2009  . ANEMIA, IRON DEFICIENCY 05/29/2008  . RHINITIS, ALLERGIC 07/19/2006  . Asthma, persistent 07/19/2006    Past Surgical History:  Procedure Laterality Date  . ABDOMINAL HYSTERECTOMY    . CHOLECYSTECTOMY    . CT ABD W & PELVIS WO CM  08/2010   IUD in place, constipation  . NASAL SEPTUM SURGERY     deviated septum  . ROBOTIC ASSISTED TOTAL HYSTERECTOMY WITH BILATERAL SALPINGO OOPHERECTOMY N/A 09/19/2017   Procedure: XI ROBOTIC ASSISTED TOTAL HYSTERECTOMY WITH BILATERAL SALPINGECTOMY AND RIGHT OOPHORECTOMY;  Surgeon: Everitt Amber,  MD;  Location: [facility];  Service: Gynecology;  Laterality: N/A;  . WISDOM TOOTH EXTRACTION       OB History    Gravida  9   Para  5   Term  5   Preterm      AB  4   Living  5     SAB  2   TAB  2   Ectopic      Multiple      Live Births  5            Home Medications    Prior to Admission medications   Medication Sig Start Date End Date Taking? Authorizing Provider  albuterol (PROVENTIL HFA;VENTOLIN HFA) 108 (90 Base) MCG/ACT inhaler Inhale 1-2 puffs into the lungs every 6 (six) hours as needed for wheezing or shortness of breath. 01/17/18   Bast, Traci A, NP  ferrous sulfate 325 (65 FE) MG tablet Take 1 tablet (325 mg total) by mouth 3 (three) times daily with meals. Patient not taking: Reported on 03/19/2018 04/23/14   Howard Pouch A, DO  fluticasone (FLONASE) 50 MCG/ACT nasal spray Place 2 sprays into both nostrils daily. 04/14/18   Fawze, Mina A, PA-C  Multiple Vitamin (MULTIVITAMIN) tablet Take 1 tablet by mouth daily.    [provider]  predniSONE (DELTASONE) 10 MG tablet Take 4 tablets (40 mg total) by mouth daily for 4 days. 02/21/19  02/25/19  Delia Heady, PA-C    Family History Family History  Problem Relation Age of Onset  . Cancer Cousin        breast  . Diabetes Mother   . Hypertension Mother   . Breast cancer Neg Hx     Social History Social History   Tobacco Use  . Smoking status: Former Research scientist (life sciences)  . Smokeless tobacco: Never Used  Substance Use Topics  . Alcohol use: Yes    Comment: rarely   . Drug use: No     Allergies   Aspirin   Review of Systems Review of Systems  Constitutional: Negative for chills and fever.  HENT: Positive for rhinorrhea and sore throat.   Respiratory: Positive for cough. Negative for shortness of breath.   Cardiovascular: Negative for chest pain.  Musculoskeletal: Negative for myalgias.     Physical Exam Updated Vital Signs BP 110/80   Pulse 70   Temp 99 F (37.2 C) (Oral)   Resp 20    LMP 09/19/2017   SpO2 98%   Physical Exam Vitals signs and nursing note reviewed.  Constitutional:      General: She is not in acute distress.    Appearance: She is well-developed. She is not diaphoretic.  HENT:     Head: Normocephalic and atraumatic.     Mouth/Throat:     Pharynx: Oropharynx is clear. Uvula midline.     Tonsils: No tonsillar exudate or tonsillar abscesses.  Eyes:     General: No scleral icterus.    Conjunctiva/sclera: Conjunctivae normal.  Neck:     Musculoskeletal: Normal range of motion.  Cardiovascular:     Rate and Rhythm: Normal rate and regular rhythm.     Heart sounds: Normal heart sounds.  Pulmonary:     Effort: Pulmonary effort is normal. No respiratory distress.     Breath sounds: Wheezing (bilateral lung bases) present.  Skin:    Findings: No rash.  Neurological:     Mental Status: She is alert.      ED Treatments / Results  Labs (all labs ordered are listed, but only abnormal results are displayed) Labs Reviewed - No data to display  EKG None  Radiology Dg Chest Portable 1 View  Result Date: 02/21/2019 CLINICAL DATA:  Cough, positive COVID-19 test EXAM: PORTABLE CHEST 1 VIEW COMPARISON:  04/14/2018 FINDINGS: The heart size and mediastinal contours are within normal limits. Both lungs are clear. The visualized skeletal structures are unremarkable. IMPRESSION: No active disease. Electronically Signed   By: Kathreen Devoid   On: 02/21/2019 14:21    Procedures Procedures (including critical care time)  Medications Ordered in ED Medications  predniSONE (DELTASONE) tablet 40 mg (has no administration in time range)     Initial Impression / Assessment and Plan / ED Course  I have reviewed the triage vital signs and the nursing notes.  Pertinent labs & imaging results that were available during my care of the patient were reviewed by me and considered in my medical decision making (see chart for details).        Stephanie Stanton  was evaluated in Emergency Department on 02/21/19  for the symptoms described in the history of present illness. He/she was evaluated in the context of the global COVID-19 pandemic, which necessitated consideration that the patient might be at risk for infection with the SARS-CoV-2 virus that causes COVID-19. Institutional protocols and algorithms that pertain to the evaluation of patients at risk for COVID-19 are in a state  of rapid change based on information released by regulatory bodies including the CDC and federal and state organizations. These policies and algorithms were followed during the patient's care in the ED.  43 year old female presents to ED for cough, sore throat, rhinorrhea and wheezing for the past week.  She has a history of asthma.  Sick contacts including her sons with similar symptoms.  She was tested in the outpatient setting for COVID 2 days ago and her results just came back as positive.  Denies any shortness of breath, fever, known COVID exposures or chest pain.  On my exam there is wheezing present in bilateral lung bases.  Patient temperature 99.  Oxygen saturations above 98% on room air.  She has no signs of respiratory distress, speaking complete sentence without difficulty.  Chest x-ray is negative for acute abnormalities.  Will give prednisone for asthma exacerbation.  No evidence of pneumonia.  Will advise her to follow-up with PCP and return for worsening symptoms.  Patient is hemodynamically stable, in NAD, and able to ambulate in the ED. Evaluation does not show pathology that would require ongoing emergent intervention or inpatient treatment. I explained the diagnosis to the patient. Pain has been managed and has no complaints prior to discharge. Patient is comfortable with above plan and is stable for discharge at this time. All questions were answered prior to disposition. Strict return precautions for returning to the ED were discussed. Encouraged follow up with PCP.    An After Visit Summary was printed and given to the patient.   Portions of this note were generated with [employer]. Dictation errors may occur despite best attempts at proofreading.   Final Clinical Impressions(s) / ED Diagnoses   Final diagnoses:  [id number] virus detected  Moderate persistent asthma with exacerbation    ED Discharge Orders         Ordered    predniSONE (DELTASONE) 10 MG tablet  Daily     02/21/19 1430           Delia Heady, PA-C 02/21/19 1433    Charlesetta Shanks, MD 02/24/19 [phone]

## 2019-02-27 ENCOUNTER — Other Ambulatory Visit: Payer: Self-pay

## 2019-02-27 ENCOUNTER — Emergency Department (HOSPITAL_COMMUNITY)
Admission: EM | Admit: 2019-02-27 | Discharge: 2019-02-27 | Disposition: A | Discharge status: Home / Self Care | Payer: No Typology Code available for payment source | Attending: Emergency Medicine | Admitting: Emergency Medicine

## 2019-02-27 DIAGNOSIS — L299 Pruritus, unspecified: Secondary | ICD-10-CM | POA: Insufficient documentation

## 2019-02-27 DIAGNOSIS — T7840XA Allergy, unspecified, initial encounter: Secondary | ICD-10-CM | POA: Insufficient documentation

## 2019-02-27 DIAGNOSIS — Z8709 Personal history of other diseases of the respiratory system: Secondary | ICD-10-CM | POA: Insufficient documentation

## 2019-02-27 DIAGNOSIS — J029 Acute pharyngitis, unspecified: Secondary | ICD-10-CM | POA: Insufficient documentation

## 2019-02-27 MED ORDER — EPINEPHRINE 0.3 MG/0.3ML IJ SOAJ: 0.3000 mg | INTRAMUSCULAR | 0 refills | Status: AC | PRN

## 2019-02-27 MED ORDER — DIPHENHYDRAMINE HCL 25 MG PO CAPS
50.0000 mg | ORAL_CAPSULE | Freq: Once | ORAL | Status: AC
  Administered 2019-02-27: 50 mg via ORAL
  Filled 2019-02-27: qty 2

## 2019-02-27 MED ORDER — FAMOTIDINE 20 MG PO TABS
20.0000 mg | ORAL_TABLET | Freq: Once | ORAL | Status: AC
  Administered 2019-02-27: 20 mg via ORAL
  Filled 2019-02-27: qty 1

## 2019-02-27 NOTE — ED Provider Notes (Signed)
[facility] Emergency Department Provider Note MRN:  [id number]  Arrival date & time: 02/27/19     Chief Complaint   Facial Swelling (Possible Allergic Reaction) and Sore Throat (COVID POS Sept 30th)   History of Present Illness   Stephanie Stanton is a 43 y.o. year-old female with no pertinent past medical history presenting to the ED with chief complaint of facial swelling.  Patient explains that she ate her normal breakfast this morning including cereal, almond milk, strawberries.  Began experiencing total body itching, trouble swallowing, facial swelling.  No known allergies.  Denies pain.  Patient was diagnosed with COVID-19 late last month, completed a course of prednisone 2 days ago, has been feeling well.  Denies vomiting, no diarrhea, no dental or facial pain, no chest pain, no shortness of breath, no abdominal pain.  Review of Systems  A complete 10 system review of systems was obtained and all systems are negative except as noted in the HPI and PMH.   Patient's Health History    Past Medical History:  Diagnosis Date  . Anemia   . Asthma   . Blood transfusion without reported diagnosis   . Headache    hx of rare migraine   . Hx of transfusion of packed red blood cells 04/2017  . Pneumonia    hx of   . PONV (postoperative nausea and vomiting)     Past Surgical History:  Procedure Laterality Date  . ABDOMINAL HYSTERECTOMY    . CHOLECYSTECTOMY    . CT ABD W & PELVIS WO CM  08/2010   IUD in place, constipation  . NASAL SEPTUM SURGERY     deviated septum  . ROBOTIC ASSISTED TOTAL HYSTERECTOMY WITH BILATERAL SALPINGO OOPHERECTOMY N/A 09/19/2017   Procedure: XI ROBOTIC ASSISTED TOTAL HYSTERECTOMY WITH BILATERAL SALPINGECTOMY AND RIGHT OOPHORECTOMY;  Surgeon: Everitt Amber, MD;  Location: [facility];  Service: Gynecology;  Laterality: N/A;  . WISDOM TOOTH EXTRACTION      Family History  Problem Relation Age of Onset  . Cancer Cousin        breast   . Diabetes Mother   . Hypertension Mother   . Breast cancer Neg Hx     Social History   Socioeconomic History  . Marital status: Legally Separated    Spouse name: Not on file  . Number of children: Not on file  . Years of education: Not on file  . Highest education level: Not on file  Occupational History  . Not on file  Social Needs  . Financial resource strain: Not on file  . Food insecurity    Worry: Not on file    Inability: Not on file  . Transportation needs    Medical: Not on file    Non-medical: Not on file  Tobacco Use  . Smoking status: Former Research scientist (life sciences)  . Smokeless tobacco: Never Used  Substance and Sexual Activity  . Alcohol use: Yes    Comment: rarely   . Drug use: No  . Sexual activity: Not Currently    Partners: Male    Birth control/protection: None  Lifestyle  . Physical activity    Days per week: Not on file    Minutes per session: Not on file  . Stress: Not on file  Relationships  . Social Herbalist on phone: Not on file    Gets together: Not on file    Attends religious service: Not on file    Active member of  club or organization: Not on file    Attends meetings of clubs or organizations: Not on file    Relationship status: Not on file  . Intimate partner violence    Fear of current or ex partner: Not on file    Emotionally abused: Not on file    Physically abused: Not on file    Forced sexual activity: Not on file  Other Topics Concern  . Not on file  Social History Narrative  . Not on file     Physical Exam  Vital Signs and Nursing Notes reviewed Vitals:   02/27/19 1500 02/27/19 1530  BP: 104/76 114/66  Pulse: 88 62  Resp: 13 14  Temp:    SpO2: 99% 99%    CONSTITUTIONAL: Well-appearing, NAD NEURO:  Alert and oriented x 3, no focal deficits EYES:  eyes equal and reactive ENT/NECK:  no LAD, no JVD CARDIO: Regular rate, well-perfused, normal S1 and S2 PULM:  CTAB no wheezing or rhonchi GI/GU:  normal bowel sounds,  non-distended, non-tender MSK/SPINE:  No gross deformities, no edema SKIN:  no rash, atraumatic PSYCH:  Appropriate speech and behavior  Diagnostic and Interventional Summary    Labs Reviewed - No data to display  No orders to display    Medications  diphenhydrAMINE (BENADRYL) capsule 50 mg (50 mg Oral Given 02/27/19 1452)  famotidine (PEPCID) tablet 20 mg (20 mg Oral Given 02/27/19 1452)     Procedures Critical Care  ED Course and Medical Decision Making  I have reviewed the triage vital signs and the nursing notes.  Pertinent labs & imaging results that were available during my care of the patient were reviewed by me and considered in my medical decision making (see below for details).  No evident swelling to the airway, possibly some swelling to the soft tissue of the neck and cheeks but it is not clearly evident, unknown baseline.  Vitals stable, no increased work of breathing, no stridor, no wheezing, vital signs reassuring, will monitor for a few hours, provide antihistamines, likely discharge with EpiPen.  Patient had her normal breakfast but did get the strawberries from a different source.  Upon reassessment patient is feeling better.  Continues to have no objective swelling on my exam.  Normal vital signs throughout her ED stay.  Appropriate for discharge with antihistamines, prescribed EpiPen.  Elmer SowMichael M. Pilar PlateBero, MD [facility] [email or web address]  Final Clinical Impressions(s) / ED Diagnoses     ICD-10-CM   1. Allergic reaction, initial encounter  T78.40XA     ED Discharge Orders         Ordered    EPINEPHrine (EPIPEN 2-PAK) 0.3 mg/0.3 mL IJ SOAJ injection  As needed     02/27/19 1634          Discharge Instructions Discussed with and Provided to Patient:   Discharge Instructions     You were evaluated in the Emergency Department and after careful evaluation, we did not find any emergent condition requiring  admission or further testing in the hospital.  Your exam/testing today is overall reassuring.  Your symptoms are consistent with an allergic reaction.  We recommend that you fill the prescription for the EpiPen and have it with you at all times until you can follow-up with an allergist.  We also recommend over-the-counter Benadryl for mild symptoms.  Please return to the Emergency Department if you experience any worsening of your condition.  We encourage you to follow up with a  primary care provider.  Thank you for allowing [place] to be a part of your care.       Sabas Sous, MD 02/27/19 [phone]

## 2019-02-27 NOTE — ED Notes (Signed)
Pt verbalizes understanding of DC instructions. Pt belongings returned and is ambulatory out of ED.  

## 2019-02-27 NOTE — ED Triage Notes (Addendum)
Pt arrived POV CC sore throat and facial swellling after eating breakfast "granola with peanuts, strawberries, and almond milk as well and tea with honey". Pt reports "this is what I eat everyday" Pt tested COVID POS September 30th Rx Prednisone X 5 days. Pt denies SOB able to speak in complete sentence but endorses trouble swallowing, dizzyness, and itching in hands/feet.   Upon intail assessment face and throat seem swollen. VSS afebrile

## 2019-02-27 NOTE — Discharge Instructions (Addendum)
You were evaluated in the Emergency Department and after careful evaluation, we did not find any emergent condition requiring admission or further testing in the hospital.  Your exam/testing today is overall reassuring.  Your symptoms are consistent with an allergic reaction.  We recommend that you fill the prescription for the EpiPen and have it with you at all times until you can follow-up with an allergist.  We also recommend over-the-counter Benadryl for mild symptoms.  Please return to the Emergency Department if you experience any worsening of your condition.  We encourage you to follow up with a primary care provider.  Thank you for allowing [place] to be a part of your care.

## 2020-05-28 IMAGING — MG DIGITAL DIAGNOSTIC BILATERAL MAMMOGRAM WITH TOMO AND CAD
8 series · 8 of 24 positions shown · non-contrast
Comparison: Previous exam(s).

CLINICAL DATA: Follow-up of a mass seen in 1171. The patient never
returned for requested follow-up.

EXAM:
DIGITAL DIAGNOSTIC BILATERAL MAMMOGRAM WITH CAD AND TOMO
ULTRASOUND LEFT BREAST

[R CC synth-2D]
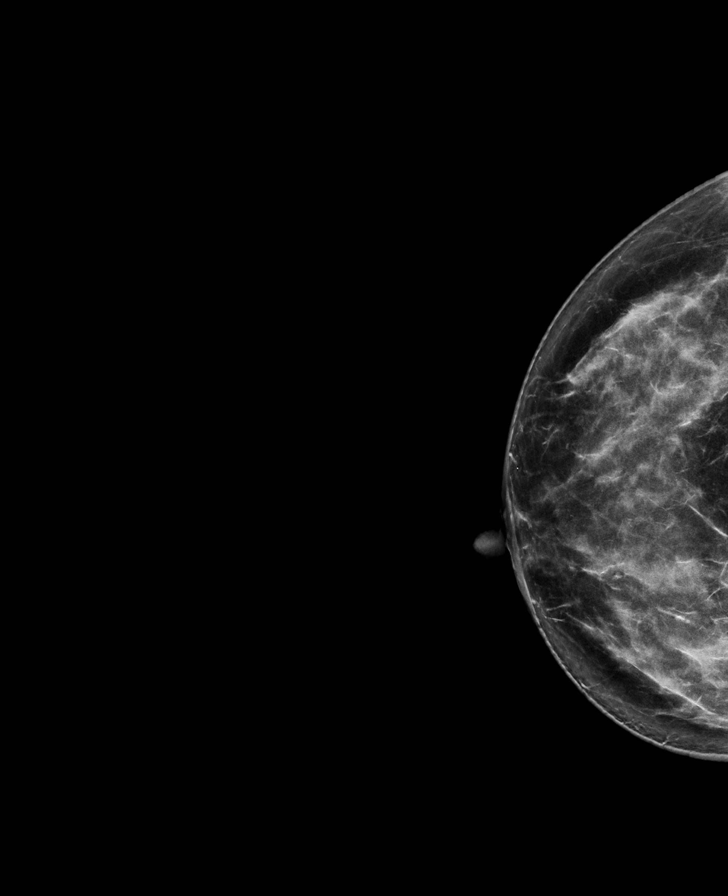

[L CC synth-2D]
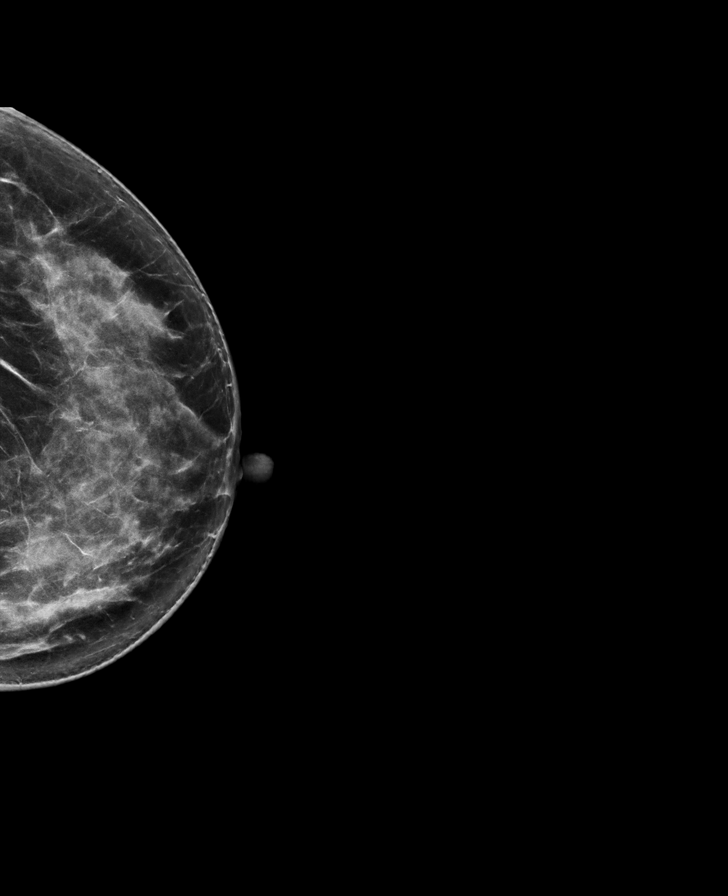

[R MLO synth-2D]
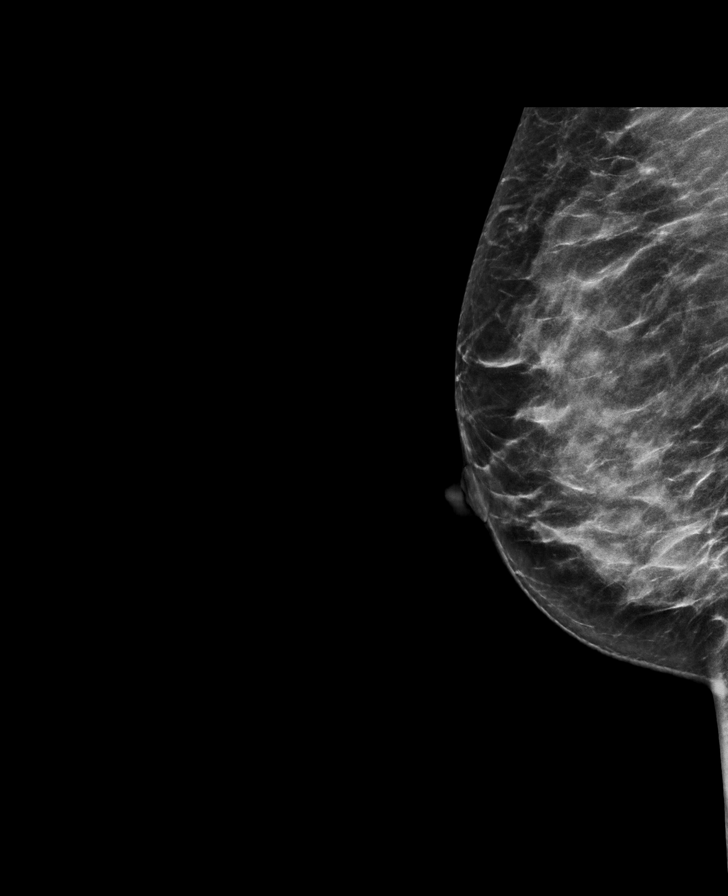

[L MLO synth-2D]
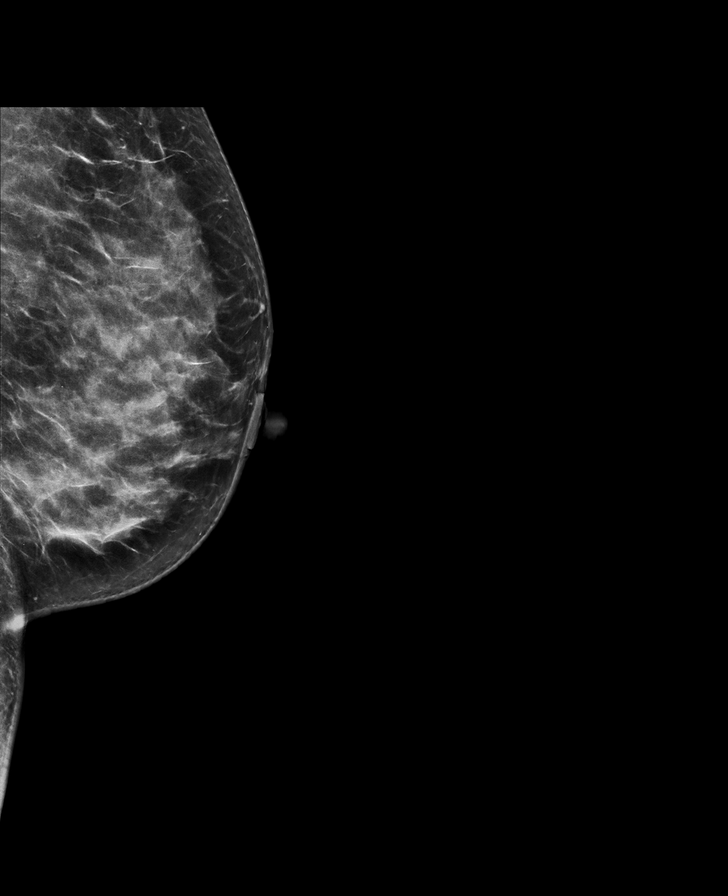

[L MLO tomo · tomo slice 37/74.0]
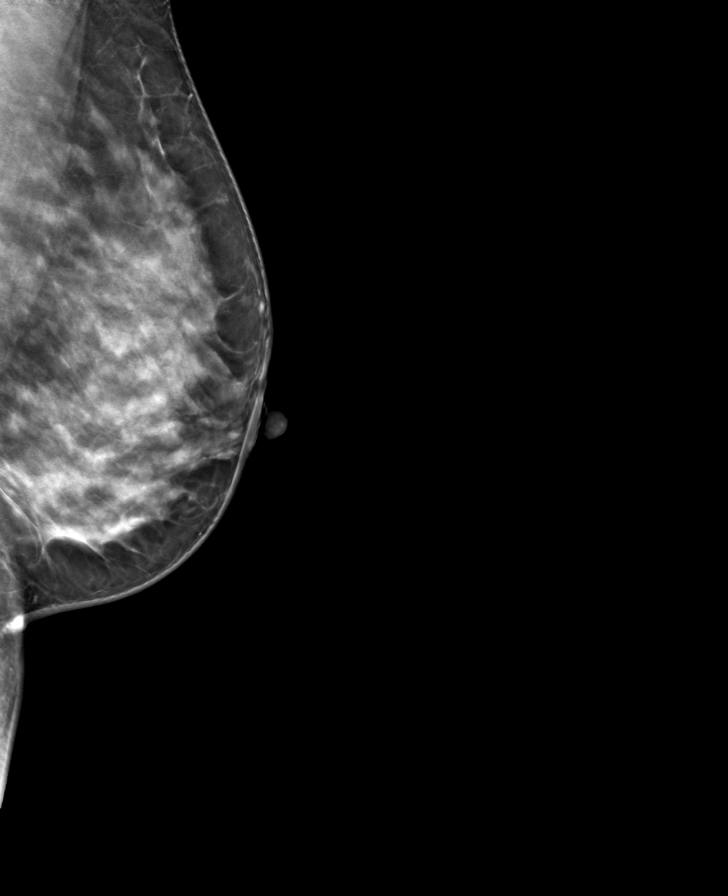

[R MLO tomo · tomo slice 37/74.0]
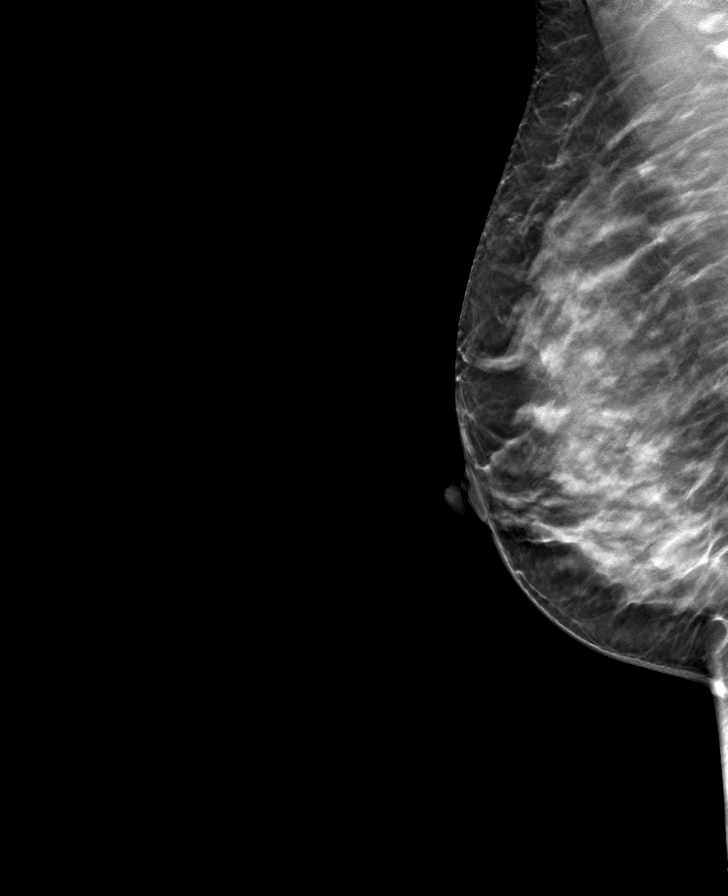

[R CC tomo · tomo slice 36/71.0]
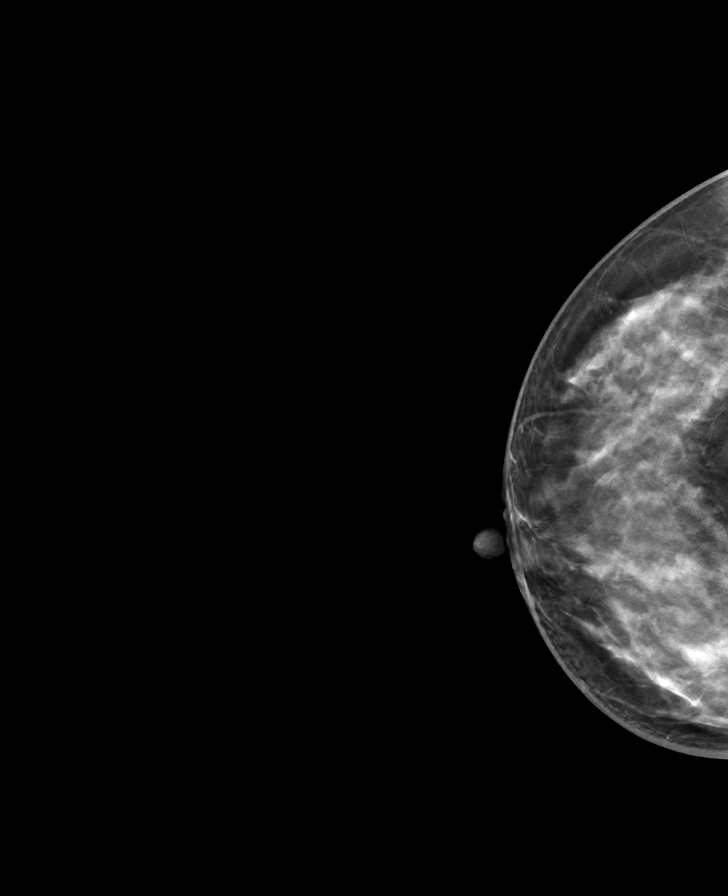

[L CC tomo · tomo slice 37/73.0]
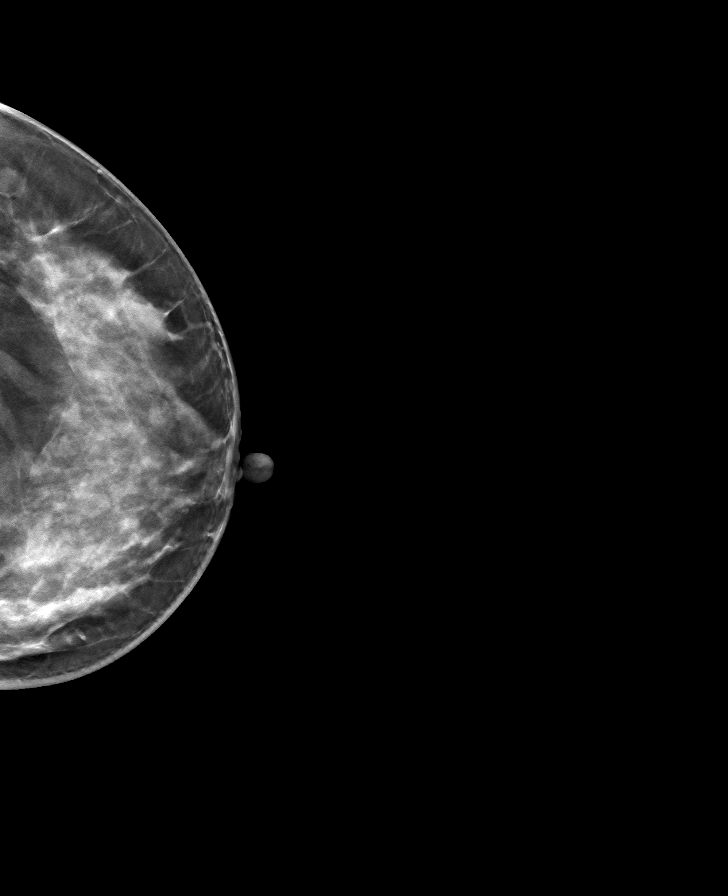

[8 of 24 positions shown; findings below may reference images not displayed]

ACR Breast Density Category c: The breast tissue is heterogeneously
dense, which may obscure small masses.
FINDINGS: The mass in the lateral left breast is stable mammographically. No
suspicious masses, calcifications, or distortion otherwise
identified in either breast.

Mammographic images were processed with CAD.

On physical exam, no suspicious lumps are identified.

Targeted ultrasound is performed, showing a hypoechoic mass at 4
o'clock, 4 cm from the nipple in the left breast measuring 14 x 8 x
16 mm today versus 13 x 8 x 16 mm previously.
IMPRESSION: Benign left breast mass, stable since 1171. No other suspicious
findings in either breast.

RECOMMENDATION:
Annual screening mammography.

I have discussed the findings and recommendations with the patient.
Results were also provided in writing at the conclusion of the
visit. If applicable, a reminder letter will be sent to the patient
regarding the next appointment.

BI-RADS CATEGORY  2: Benign.
